# Patient Record
Sex: Male | Born: 1955 | Race: White | Hispanic: No | Marital: Married | Smoking: Former smoker
Health system: Southern US, Community
[De-identification: ages and names within clinical notes are randomized; demographics above are authoritative.]

## PROBLEM LIST (undated history)

## (undated) DIAGNOSIS — E7800 Pure hypercholesterolemia, unspecified: Secondary | ICD-10-CM

## (undated) DIAGNOSIS — M48061 Spinal stenosis, lumbar region without neurogenic claudication: Secondary | ICD-10-CM

## (undated) DIAGNOSIS — E756 Lipid storage disorder, unspecified: Secondary | ICD-10-CM

## (undated) DIAGNOSIS — E8809 Other disorders of plasma-protein metabolism, not elsewhere classified: Secondary | ICD-10-CM

## (undated) DIAGNOSIS — M1990 Unspecified osteoarthritis, unspecified site: Secondary | ICD-10-CM

## (undated) HISTORY — PX: APPENDECTOMY: SHX54

## (undated) HISTORY — PX: TONSILLECTOMY: SUR1361

## (undated) HISTORY — PX: OTHER SURGICAL HISTORY: SHX169

## (undated) HISTORY — PX: BACK SURGERY: SHX140

## (undated) HISTORY — DX: Spinal stenosis, lumbar region without neurogenic claudication: M48.061

## (undated) HISTORY — PX: SHOULDER SURGERY: SHX246

## (undated) HISTORY — PX: HERNIA REPAIR: SHX51

## (undated) HISTORY — PX: SINUS EXPLORATION: SHX5214

---

## 1999-05-06 ENCOUNTER — Encounter: Payer: Self-pay | Admitting: Orthopedic Surgery

## 1999-05-06 ENCOUNTER — Ambulatory Visit (HOSPITAL_COMMUNITY): Admission: RE | Admit: 1999-05-06 | Discharge: 1999-05-06 | Payer: Self-pay | Admitting: Orthopedic Surgery

## 2008-11-09 ENCOUNTER — Emergency Department (HOSPITAL_COMMUNITY): Admission: EM | Admit: 2008-11-09 | Discharge: 2008-11-09 | Payer: Self-pay | Admitting: Emergency Medicine

## 2008-11-10 ENCOUNTER — Ambulatory Visit: Payer: Self-pay | Admitting: Gastroenterology

## 2008-11-25 ENCOUNTER — Emergency Department (HOSPITAL_COMMUNITY): Admission: EM | Admit: 2008-11-25 | Discharge: 2008-11-25 | Payer: Self-pay | Admitting: Emergency Medicine

## 2009-09-21 ENCOUNTER — Encounter: Admission: RE | Admit: 2009-09-21 | Discharge: 2009-09-21 | Payer: Self-pay | Admitting: Orthopedic Surgery

## 2010-11-09 LAB — POCT CARDIAC MARKERS
CKMB, poc: 1.2 ng/mL (ref 1.0–8.0)
Troponin i, poc: <0.05 ng/mL (ref 0.00–0.09)

## 2010-11-09 LAB — COMPREHENSIVE METABOLIC PANEL
ALT: 73 U/L — ABNORMAL HIGH (ref 0–53)
AST: 41 U/L — ABNORMAL HIGH (ref 0–37)
Albumin: 4 g/dL (ref 3.5–5.2)
Calcium: 9.5 mg/dL (ref 8.4–10.5)
Creatinine, Ser: 0.79 mg/dL (ref 0.4–1.5)
GFR calc Af Amer: >60 mL/min (ref >60)
GFR calc non Af Amer: >60 mL/min (ref >60)
Sodium: 137 mEq/L (ref 135–145)
Total Protein: 7.4 g/dL (ref 6.0–8.3)

## 2010-11-09 LAB — CBC
HCT: 43.6 % (ref 39.0–52.0)
Hemoglobin: 15 g/dL (ref 13.0–17.0)
MCHC: 34.4 g/dL (ref 30.0–36.0)
MCV: 90.9 fL (ref 78.0–100.0)
Platelets: 215 10*3/uL (ref 150–400)
RBC: 4.8 MIL/uL (ref 4.22–5.81)
RDW: 12.9 % (ref 11.5–15.5)
WBC: 10.8 10*3/uL — ABNORMAL HIGH (ref 4.0–10.5)

## 2010-11-09 LAB — D-DIMER, QUANTITATIVE: D-Dimer, Quant: 0.39 ug/mL-FEU (ref 0.00–0.48)

## 2010-11-09 LAB — DIFFERENTIAL
Eosinophils Relative: 0 % (ref 0–5)
Lymphocytes Relative: 7 % — ABNORMAL LOW (ref 12–46)
Lymphs Abs: 0.8 10*3/uL (ref 0.7–4.0)
Monocytes Absolute: 0.6 10*3/uL (ref 0.1–1.0)
Monocytes Relative: 5 % (ref 3–12)

## 2011-11-15 ENCOUNTER — Emergency Department (HOSPITAL_COMMUNITY): Payer: Medicare Other

## 2011-11-15 ENCOUNTER — Encounter (HOSPITAL_COMMUNITY): Payer: Self-pay | Admitting: *Deleted

## 2011-11-15 ENCOUNTER — Emergency Department (HOSPITAL_COMMUNITY)
Admission: EM | Admit: 2011-11-15 | Discharge: 2011-11-16 | Disposition: A | Discharge status: Home / Self Care | Payer: Medicare Other | Attending: Emergency Medicine | Admitting: Emergency Medicine

## 2011-11-15 DIAGNOSIS — E7800 Pure hypercholesterolemia, unspecified: Secondary | ICD-10-CM | POA: Insufficient documentation

## 2011-11-15 DIAGNOSIS — R0602 Shortness of breath: Secondary | ICD-10-CM | POA: Insufficient documentation

## 2011-11-15 DIAGNOSIS — J45909 Unspecified asthma, uncomplicated: Secondary | ICD-10-CM | POA: Insufficient documentation

## 2011-11-15 DIAGNOSIS — Z87891 Personal history of nicotine dependence: Secondary | ICD-10-CM | POA: Insufficient documentation

## 2011-11-15 DIAGNOSIS — R079 Chest pain, unspecified: Secondary | ICD-10-CM | POA: Insufficient documentation

## 2011-11-15 HISTORY — DX: Pure hypercholesterolemia, unspecified: E78.00

## 2011-11-15 LAB — DIFFERENTIAL
Basophils Absolute: 0 10*3/uL (ref 0.0–0.1)
Basophils Relative: 1 % (ref 0–1)
Lymphocytes Relative: 20 % (ref 12–46)
Monocytes Relative: 8 % (ref 3–12)
Neutro Abs: 5.9 10*3/uL (ref 1.7–7.7)
Neutrophils Relative %: 68 % (ref 43–77)

## 2011-11-15 LAB — CBC
Hemoglobin: 14.8 g/dL (ref 13.0–17.0)
MCHC: 33.4 g/dL (ref 30.0–36.0)
RDW: 13.1 % (ref 11.5–15.5)
WBC: 8.7 10*3/uL (ref 4.0–10.5)

## 2011-11-15 LAB — CARDIAC PANEL(CRET KIN+CKTOT+MB+TROPI)
Relative Index: INVALID (ref 0.0–2.5)
Total CK: 96 U/L (ref 7–232)
Troponin I: <0.3 ng/mL (ref <0.30)

## 2011-11-15 LAB — BASIC METABOLIC PANEL
Chloride: 100 mEq/L (ref 96–112)
GFR calc Af Amer: >90 mL/min (ref >90)
Potassium: 4.8 mEq/L (ref 3.5–5.1)

## 2011-11-15 MED ORDER — IPRATROPIUM BROMIDE 0.02 % IN SOLN
0.5000 mg | Freq: Once | RESPIRATORY_TRACT | Status: AC
  Administered 2011-11-15: 0.5 mg via RESPIRATORY_TRACT
  Filled 2011-11-15: qty 2.5

## 2011-11-15 MED ORDER — ACETAMINOPHEN 500 MG PO TABS
1000.0000 mg | ORAL_TABLET | Freq: Once | ORAL | Status: AC
  Administered 2011-11-15: 1000 mg via ORAL
  Filled 2011-11-15: qty 2

## 2011-11-15 MED ORDER — CYCLOBENZAPRINE HCL 10 MG PO TABS
10.0000 mg | ORAL_TABLET | Freq: Once | ORAL | Status: AC
  Administered 2011-11-15: 10 mg via ORAL
  Filled 2011-11-15: qty 1

## 2011-11-15 MED ORDER — ALBUTEROL SULFATE (5 MG/ML) 0.5% IN NEBU
5.0000 mg | INHALATION_SOLUTION | Freq: Once | RESPIRATORY_TRACT | Status: AC
  Administered 2011-11-15: 5 mg via RESPIRATORY_TRACT
  Filled 2011-11-15: qty 1

## 2011-11-15 NOTE — ED Notes (Signed)
Pt stated he has had recurrent asthma attacks for past 5 days, last 30 minutes felt pain in rt side of chest with SOB and diaphoresis.

## 2011-11-15 NOTE — ED Provider Notes (Addendum)
History      CSN: [id number]  Arrival date & time 11/15/11  2022   First MD Initiated Contact with Patient 11/15/11 2200      Chief Complaint  Patient presents with  . Shortness of Breath  . Chest Pain    (Consider location/radiation/quality/duration/timing/severity/associated sxs/prior treatment) HPI....asthma for the past 5 days. Also muscular pain in the right side of her chest. Some shortness of breath. No substernal chest pain, nausea.  Deep breath makes symptoms worse. No radiation. Symptoms are minimal.      Past Medical History  Diagnosis Date  . Asthma   . High cholesterol     Past Surgical History  Procedure Date  . Appendectomy   . Back surgery   . Hernia repair   . Joint replacement       History  Substance Use Topics  . Smoking status: Former Games developer  . Smokeless tobacco: Not on file  . Alcohol Use: No      Review of Systems  All other systems reviewed and are negative.    10 Systems reviewed and all are negative for acute change except as noted in the HPI.    Allergies  Aspirin; Morphine and related; and Opium  Home Medications  No current outpatient prescriptions on file.  BP 128/78  Pulse 91  Temp(Src) 98.7 F (37.1 C) (Oral)  Resp 20  Ht 5\' 10"  (1.778 m)  Wt 200 lb (90.719 kg)  BMI 28.70 kg/m2  SpO2 98%  Physical Exam  Nursing note and vitals reviewed. Constitutional: He is oriented to person, place, and time. He appears well-developed and well-nourished.  HENT:  Head: Normocephalic and atraumatic.  Eyes: Conjunctivae and EOM are normal. Pupils are equal, round, and reactive to light.  Neck: Normal range of motion. Neck supple.  Cardiovascular: Normal rate and regular rhythm.   Pulmonary/Chest: Effort normal.       Slight expiratory wheeze  Abdominal: Soft. Bowel sounds are normal.  Musculoskeletal: Normal range of motion.  Neurological: He is alert and oriented to person, place, and time.  Skin: Skin is warm and  dry.  Psychiatric: He has a normal mood and affect.    ED Course  Procedures (including critical care time)  DIAGNOSTIC STUDIES: Oxygen Saturation is 98% on room air, normal by my interpretation.    COORDINATION OF CARE:  Results for orders placed during the hospital encounter of 11/15/11  CBC      Component Value Range   WBC 8.7  4.0 - 10.5 (K/uL)   RBC 4.84  4.22 - 5.81 (MIL/uL)   Hemoglobin 14.8  13.0 - 17.0 (g/dL)   HCT 40.9  81.1 - 91.4 (%)   MCV 91.5  78.0 - 100.0 (fL)   MCH 30.6  26.0 - 34.0 (pg)   MCHC 33.4  30.0 - 36.0 (g/dL)   RDW 78.2  95.6 - 21.3 (%)   Platelets 216  150 - 400 (K/uL)  DIFFERENTIAL      Component Value Range   Neutrophils Relative 68  43 - 77 (%)   Neutro Abs 5.9  1.7 - 7.7 (K/uL)   Lymphocytes Relative 20  12 - 46 (%)   Lymphs Abs 1.7  0.7 - 4.0 (K/uL)   Monocytes Relative 8  3 - 12 (%)   Monocytes Absolute 0.7  0.1 - 1.0 (K/uL)   Eosinophils Relative 3  0 - 5 (%)   Eosinophils Absolute 0.3  0.0 - 0.7 (K/uL)   Basophils Relative 1  0 - 1 (%)   Basophils Absolute 0.0  0.0 - 0.1 (K/uL)  BASIC METABOLIC PANEL      Component Value Range   Sodium 137  135 - 145 (mEq/L)   Potassium 4.8  3.5 - 5.1 (mEq/L)   Chloride 100  96 - 112 (mEq/L)   CO2 27  19 - 32 (mEq/L)   Glucose, Bld 107 (*) 70 - 99 (mg/dL)   BUN 11  6 - 23 (mg/dL)   Creatinine, Ser 0.10  0.50 - 1.35 (mg/dL)   Calcium 27.2  8.4 - 10.5 (mg/dL)   GFR calc non Af Amer >90  >90 (mL/min)   GFR calc Af Amer >90  >90 (mL/min)  CARDIAC PANEL(CRET KIN+CKTOT+MB+TROPI)      Component Value Range   Total CK 96  7 - 232 (U/L)   CK, MB 2.1  0.3 - 4.0 (ng/mL)   Troponin I <0.30  <0.30 (ng/mL)   Relative Index RELATIVE INDEX IS INVALID  0.0 - 2.5    No results found.  Labs Reviewed - No data to display No results found.   No diagnosis found.  **PM- EDP at bedside discusses treatment plan concerning   Date: 11/15/2011  Rate: 93  Rhythm: normal sinus rhythm  QRS Axis: normal   Intervals: normal  ST/T Wave abnormalities: normal  Conduction Disutrbances: none  Narrative Interpretation: unremarkable    MDM  Patient feeling better after breathing treatments. Screening tests normal.  Can discharge home. Prescription for Flexeril given for muscle tightness     I personally performed the services described in this documentation, which was scribed in my presence. The recorded information has been reviewed and considered.    Donnetta Hutching, MD 11/19/11 1546  Donnetta Hutching, MD 12/31/11 [phone]

## 2011-11-16 MED ORDER — CYCLOBENZAPRINE HCL 10 MG PO TABS: 10.0000 mg | ORAL_TABLET | Freq: Two times a day (BID) | ORAL | Status: AC | PRN

## 2011-11-16 NOTE — Discharge Instructions (Signed)
Tests were normal.  In your breathing treatments.  I believe the pain is in the wall of your chest.  Tylenol for pain

## 2015-08-01 HISTORY — PX: JOINT REPLACEMENT: SHX530

## 2016-03-13 ENCOUNTER — Emergency Department (HOSPITAL_COMMUNITY): Payer: Non-veteran care

## 2016-03-13 ENCOUNTER — Encounter (HOSPITAL_COMMUNITY): Payer: Self-pay | Admitting: Emergency Medicine

## 2016-03-13 ENCOUNTER — Emergency Department (HOSPITAL_COMMUNITY)
Admission: EM | Admit: 2016-03-13 | Discharge: 2016-03-13 | Disposition: A | Discharge status: Home / Self Care | Payer: Non-veteran care | Attending: Emergency Medicine | Admitting: Emergency Medicine

## 2016-03-13 DIAGNOSIS — J45909 Unspecified asthma, uncomplicated: Secondary | ICD-10-CM | POA: Insufficient documentation

## 2016-03-13 DIAGNOSIS — Z87891 Personal history of nicotine dependence: Secondary | ICD-10-CM | POA: Diagnosis not present

## 2016-03-13 DIAGNOSIS — R002 Palpitations: Secondary | ICD-10-CM | POA: Insufficient documentation

## 2016-03-13 HISTORY — DX: Other disorders of plasma-protein metabolism, not elsewhere classified: E88.09

## 2016-03-13 HISTORY — DX: Lipid storage disorder, unspecified: E75.6

## 2016-03-13 LAB — CBC WITH DIFFERENTIAL/PLATELET
BASOS ABS: 0 10*3/uL (ref 0.0–0.1)
Basophils Relative: 0 %
Eosinophils Absolute: 0 10*3/uL (ref 0.0–0.7)
Eosinophils Relative: 0 %
HEMATOCRIT: 41 % (ref 39.0–52.0)
HEMOGLOBIN: 13.5 g/dL (ref 13.0–17.0)
LYMPHS PCT: 14 %
Lymphs Abs: 1.7 10*3/uL (ref 0.7–4.0)
MCH: 30.3 pg (ref 26.0–34.0)
MCHC: 32.9 g/dL (ref 30.0–36.0)
MCV: 91.9 fL (ref 78.0–100.0)
MONO ABS: 0.8 10*3/uL (ref 0.1–1.0)
Monocytes Relative: 7 %
NEUTROS ABS: 9.1 10*3/uL — AB (ref 1.7–7.7)
NEUTROS PCT: 79 %
Platelets: 258 10*3/uL (ref 150–400)
RBC: 4.46 MIL/uL (ref 4.22–5.81)
RDW: 13.1 % (ref 11.5–15.5)
WBC: 11.6 10*3/uL — ABNORMAL HIGH (ref 4.0–10.5)

## 2016-03-13 LAB — COMPREHENSIVE METABOLIC PANEL
ALK PHOS: 57 U/L (ref 38–126)
ALT: 17 U/L (ref 17–63)
AST: 16 U/L (ref 15–41)
Albumin: 4.2 g/dL (ref 3.5–5.0)
Anion gap: 8 (ref 5–15)
BILIRUBIN TOTAL: 0.8 mg/dL (ref 0.3–1.2)
BUN: 10 mg/dL (ref 6–20)
CALCIUM: 9.3 mg/dL (ref 8.9–10.3)
CO2: 26 mmol/L (ref 22–32)
CREATININE: 0.64 mg/dL (ref 0.61–1.24)
Chloride: 104 mmol/L (ref 101–111)
GFR calc Af Amer: >60 mL/min (ref >60)
GLUCOSE: 114 mg/dL — AB (ref 65–99)
POTASSIUM: 3.9 mmol/L (ref 3.5–5.1)
Sodium: 138 mmol/L (ref 135–145)
TOTAL PROTEIN: 7.7 g/dL (ref 6.5–8.1)

## 2016-03-13 LAB — I-STAT TROPONIN, ED: Troponin i, poc: 0 ng/mL (ref 0.00–0.08)

## 2016-03-13 LAB — TROPONIN I

## 2016-03-13 MED ORDER — SODIUM CHLORIDE 0.9 % IV BOLUS (SEPSIS)
1000.0000 mL | Freq: Once | INTRAVENOUS | Status: AC
  Administered 2016-03-13: 1000 mL via INTRAVENOUS

## 2016-03-13 NOTE — Discharge Instructions (Signed)
You were seen in the ED today with heart palpitations. We evaluated you closely and did not find an reason for your symptoms. You should follow up with your PCP regarding a possible outpatient stress test.   Return to the ED immediately with any chest pain, difficulty breathing, arm pain, back pain, jaw/neck pain.

## 2016-03-13 NOTE — ED Provider Notes (Signed)
Emergency Department Provider Note   I have reviewed the triage vital signs and the nursing notes.   HISTORY  Chief Complaint Palpitations   HPI Levi Kim is a 60 y.o. male with PMH of asthma, HLD, recent hip replacement on Eliquis presents to the emergency department for evaluation of chest tightness and palpitations that started abruptly 6 hours prior to ED presentation. Patient states that he had some central chest tightness and palpitations with radiation to his arms. The patient suspects that he may have been dehydrated. He drank 3 glasses of water and had a bowel movement and symptoms improved. On arrival to the emergency department he has some continued mild chest pressure but no palpitations. No radiation down his arm. No exacerbating or alleviating factors. Pain is nonexertional. No associated dyspnea.   Past Medical History:  Diagnosis Date  . Alpha galactosidase deficiency   . Asthma   . High cholesterol     There are no active problems to display for this patient.   Past Surgical History:  Procedure Laterality Date  . APPENDECTOMY    . BACK SURGERY    . HERNIA REPAIR    . JOINT REPLACEMENT      Current Outpatient Rx  . Order #: [id number] Class: Historical Med  . Order #: [id number] Class: Historical Med  . Order #: [id number] Class: Historical Med  . Order #: [id number] Class: Historical Med  . Order #: [id number] Class: Historical Med  . Order #: [id number] Class: Historical Med  . Order #: [id number] Class: Historical Med  . Order #: [id number] Class: Historical Med  . Order #: [id number] Class: Historical Med    Allergies Aspirin; Morphine and related; and Opium  No family history on file.  Social History Social History  Substance Use Topics  . Smoking status: Former Games developermoker  . Smokeless tobacco: Never Used  . Alcohol use No    Review of Systems  Constitutional: No fever/chills Eyes: No visual changes. ENT: No sore throat. Cardiovascular: Positive chest  pain and palpitations.  Respiratory: Denies shortness of breath. Gastrointestinal: No abdominal pain.  No nausea, no vomiting.  No diarrhea.  No constipation. Genitourinary: Negative for dysuria. Musculoskeletal: Negative for back pain. Skin: Negative for rash. Neurological: Negative for headaches, focal weakness or numbness.  10-point ROS otherwise negative.  ____________________________________________   PHYSICAL EXAM:  VITAL SIGNS: ED Triage Vitals  Enc Vitals Group     BP 03/13/16 1629 162/82     Pulse Rate 03/13/16 1629 101     Resp 03/13/16 1629 17     Temp 03/13/16 1629 99.2 F (37.3 C)     Temp Source 03/13/16 1629 Temporal     SpO2 03/13/16 1629 94 %     Weight 03/13/16 1630 210 lb (95.3 kg)     Height 03/13/16 1630 5\' 9"  (1.753 m)     Pain Score 03/13/16 1640 6   Constitutional: Alert and oriented. Well appearing and in no acute distress. Eyes: Conjunctivae are normal. PERRL. EOMI. Head: Atraumatic. Nose: No congestion/rhinnorhea. Mouth/Throat: Mucous membranes are dry.  Oropharynx non-erythematous. Neck: No stridor.   Cardiovascular: Tachycardia. Good peripheral circulation. Grossly normal heart sounds.   Respiratory: Normal respiratory effort.  No retractions. Lungs CTAB. Gastrointestinal: Soft and nontender. No distention.  Musculoskeletal: No lower extremity tenderness nor edema. No gross deformities of extremities. Neurologic:  Normal speech and language. No gross focal neurologic deficits are appreciated.  Skin:  Skin is warm, dry and intact. No rash noted. Psychiatric: Mood and affect are normal. Speech and behavior  are normal.  ____________________________________________   LABS (all labs ordered are listed, but only abnormal results are displayed)  Labs Reviewed  CBC WITH DIFFERENTIAL/PLATELET - Abnormal; Notable for the following:       Result Value   WBC 11.6 (*)    Neutro Abs 9.1 (*)    All other components within normal limits    COMPREHENSIVE METABOLIC PANEL - Abnormal; Notable for the following:    Glucose, Bld 114 (*)    All other components within normal limits  TROPONIN I  I-STAT TROPOININ, ED   ____________________________________________  EKG  Reviewed in MUSE. No STEMI.  ____________________________________________  RADIOLOGY  Dg Chest 2 View  Result Date: 03/13/2016 CLINICAL DATA:  Chest pain and arm tingling. EXAM: CHEST  2 VIEW COMPARISON:  Chest radiograph 11/15/2011 FINDINGS: The lungs are well inflated. Cardiomediastinal contours are normal. No pneumothorax or pleural effusion. No focal airspace consolidation or pulmonary edema. IMPRESSION: No focal airspace disease. Electronically Signed   By: Deatra RobinsonKevin  Herman M.D.   On: 03/13/2016 17:17    ____________________________________________   PROCEDURES  Procedure(s) performed:   Procedures  None ____________________________________________   INITIAL IMPRESSION / ASSESSMENT AND PLAN / ED COURSE  Pertinent labs & imaging results that were available during my care of the patient were reviewed by me and considered in my medical decision making (see chart for details).  Patient resents to the emergency department for evaluation of chest tightness, palpitations that is since resolved. He does have an aspirin allergy and so will not give aspirin. Symptoms improved with rest and drinking water. Patient reports minimal symptoms at this time. HEART score 2 for age and HLD. Very low suspicion for DVT/PE. Plan for serial biomarkers and reassessment. Will give IV fluids and reassess. No evidence on exam of by history to suggest bleed 2/2 current anticoagulation.   11:04 PM Patient's repeat troponin is negative. He has not had additional symptoms since arrival to the emergency department. He is feeling well and has been ambulating around the ED while waiting for second troponin. Discussed in detail my plan for him including primary care physician follow-up  and discussion of scheduling outpatient stress test. Per these return to the emergency department immediately if he has any additional chest discomfort.   At this time, I do not feel there is any life-threatening condition present. I have reviewed and discussed all results (EKG, imaging, lab, urine as appropriate), exam findings with patient. I have reviewed nursing notes and appropriate previous records.  I feel the patient is safe to be discharged home without further emergent workup. Discussed usual and customary return precautions. Patient and family (if present) verbalize understanding and are comfortable with this plan.  Patient will follow-up with their primary care provider. If they do not have a primary care provider, information for follow-up has been provided to them. All questions have been answered.  ____________________________________________  FINAL CLINICAL IMPRESSION(S) / ED DIAGNOSES  Final diagnoses:  Heart palpitations     MEDICATIONS GIVEN DURING THIS VISIT:  Medications  sodium chloride 0.9 % bolus 1,000 mL (0 mLs Intravenous Stopped 03/13/16 2119)     NEW OUTPATIENT MEDICATIONS STARTED DURING THIS VISIT:  None   Note:  This document was prepared using Dragon voice recognition software and may include unintentional dictation errors.  Alona BeneJoshua Drevin Ortner, MD Emergency Medicine   Maia PlanJoshua G Natassia Guthridge, MD 03/14/16 [phone]

## 2016-03-13 NOTE — ED Triage Notes (Addendum)
PT states today around 1330 he started having chest palpations and became dizzy. PT also states some SOB at times today. PT denies any chest pain at this time. PT stated he had a hip replacement surgery x4 weeks ago and was started on Eliquis.

## 2017-01-03 ENCOUNTER — Encounter (HOSPITAL_COMMUNITY): Payer: Self-pay | Admitting: *Deleted

## 2017-01-03 ENCOUNTER — Emergency Department (HOSPITAL_COMMUNITY): Payer: Non-veteran care

## 2017-01-03 ENCOUNTER — Emergency Department (HOSPITAL_COMMUNITY)
Admission: EM | Admit: 2017-01-03 | Discharge: 2017-01-03 | Disposition: A | Discharge status: Home / Self Care | Payer: Non-veteran care | Attending: Emergency Medicine | Admitting: Emergency Medicine

## 2017-01-03 DIAGNOSIS — Z87891 Personal history of nicotine dependence: Secondary | ICD-10-CM | POA: Diagnosis not present

## 2017-01-03 DIAGNOSIS — Z96641 Presence of right artificial hip joint: Secondary | ICD-10-CM | POA: Diagnosis not present

## 2017-01-03 DIAGNOSIS — M25551 Pain in right hip: Secondary | ICD-10-CM | POA: Diagnosis present

## 2017-01-03 DIAGNOSIS — J45909 Unspecified asthma, uncomplicated: Secondary | ICD-10-CM | POA: Diagnosis not present

## 2017-01-03 DIAGNOSIS — Z79899 Other long term (current) drug therapy: Secondary | ICD-10-CM | POA: Insufficient documentation

## 2017-01-03 LAB — URINALYSIS, ROUTINE W REFLEX MICROSCOPIC
BACTERIA UA: NONE SEEN
Bilirubin Urine: NEGATIVE
Glucose, UA: NEGATIVE mg/dL
Ketones, ur: NEGATIVE mg/dL
Leukocytes, UA: NEGATIVE
Nitrite: NEGATIVE
Protein, ur: NEGATIVE mg/dL
RBC / HPF: NONE SEEN RBC/hpf (ref 0–5)
SPECIFIC GRAVITY, URINE: 1.002 — AB (ref 1.005–1.030)
Squamous Epithelial / LPF: NONE SEEN
WBC UA: NONE SEEN WBC/hpf (ref 0–5)
pH: 6 (ref 5.0–8.0)

## 2017-01-03 LAB — BASIC METABOLIC PANEL
Anion gap: 10 (ref 5–15)
BUN: 12 mg/dL (ref 6–20)
CHLORIDE: 104 mmol/L (ref 101–111)
CO2: 24 mmol/L (ref 22–32)
CREATININE: 0.71 mg/dL (ref 0.61–1.24)
Calcium: 9.5 mg/dL (ref 8.9–10.3)
GFR calc Af Amer: >60 mL/min (ref >60)
GFR calc non Af Amer: >60 mL/min (ref >60)
Glucose, Bld: 114 mg/dL — ABNORMAL HIGH (ref 65–99)
Potassium: 3.8 mmol/L (ref 3.5–5.1)
SODIUM: 138 mmol/L (ref 135–145)

## 2017-01-03 LAB — CBC WITH DIFFERENTIAL/PLATELET
Basophils Absolute: 0 10*3/uL (ref 0.0–0.1)
Basophils Relative: 0 %
EOS ABS: 0 10*3/uL (ref 0.0–0.7)
EOS PCT: 0 %
HCT: 46.7 % (ref 39.0–52.0)
HEMOGLOBIN: 15.7 g/dL (ref 13.0–17.0)
LYMPHS ABS: 1.2 10*3/uL (ref 0.7–4.0)
Lymphocytes Relative: 13 %
MCH: 31 pg (ref 26.0–34.0)
MCHC: 33.6 g/dL (ref 30.0–36.0)
MCV: 92.1 fL (ref 78.0–100.0)
MONO ABS: 0.4 10*3/uL (ref 0.1–1.0)
MONOS PCT: 5 %
Neutro Abs: 7.5 10*3/uL (ref 1.7–7.7)
Neutrophils Relative %: 82 %
PLATELETS: 227 10*3/uL (ref 150–400)
RBC: 5.07 MIL/uL (ref 4.22–5.81)
RDW: 13 % (ref 11.5–15.5)
WBC: 9.1 10*3/uL (ref 4.0–10.5)

## 2017-01-03 NOTE — Discharge Instructions (Signed)
You can try alternating ice and heat to your hip.  Follow-up with your orthopedic doctor for recheck.

## 2017-01-03 NOTE — ED Triage Notes (Signed)
Pt comes in with right hip pain. States he had a right hip replacement July 2017. Starting in the last 2-3 months he began having increased pain in this hip. He is ambulatory with NAD.

## 2017-01-04 LAB — B. BURGDORFI ANTIBODIES: B burgdorferi Ab IgG+IgM: <0.91 {ISR} (ref 0.00–0.90)

## 2017-01-05 LAB — ROCKY MTN SPOTTED FVR ABS PNL(IGG+IGM)
RMSF IGG: NEGATIVE
RMSF IGM: 0.48 {index} (ref 0.00–0.89)

## 2017-01-06 NOTE — ED Provider Notes (Signed)
[facility]-EMERGENCY DEPT Provider Note   CSN: [id number] Arrival date & time: 01/03/17  4098     History   Chief Complaint Chief Complaint  Patient presents with  . Hip Pain    HPI Levi Kim is a 61 y.o. male.  HPI   Levi Kim is a 61 y.o. male who presents to the Emergency Department complaining of Pain to the posterior right hip. Pain has been increasing in severity for several weeks. 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 He reports pain associated with flexion and with weightbearing. Pain occasionally radiates into his groin. It is not associated with urination. Pain improves at rest. He states that he was recently started on a topical steroid cream for a nonhealing area of his incision and he is concerned that he may be developing an infection. He denies fever, chills, vomiting, abdominal pain, and dysuria  Past Medical History:  Diagnosis Date  . Alpha galactosidase deficiency   . Asthma   . High cholesterol     There are no active problems to display for this patient.   Past Surgical History:  Procedure Laterality Date  . APPENDECTOMY    . BACK SURGERY    . Fistula Repair    . HERNIA REPAIR    . JOINT REPLACEMENT  2017  . SHOULDER SURGERY    . testical removal    . TONSILLECTOMY         Home Medications    Prior to Admission medications   Medication Sig Start Date End Date Taking? Authorizing Provider  acetaminophen (TYLENOL) 500 MG tablet Take 1,500 mg by mouth 3 (three) times daily as needed. For pain. **takes only two tablets as needed for headaches**   Yes [provider]  albuterol (PROVENTIL HFA) 108 (90 BASE) MCG/ACT inhaler Inhale 2 puffs into the lungs daily as needed.   Yes [provider]  alfuzosin (UROXATRAL) 10 MG 24 hr tablet Take 1 tablet by mouth daily. 12/21/15  Yes [provider]  docusate sodium (COLACE) 100 MG capsule Take 100 mg by mouth daily as needed  for mild constipation.   Yes [provider]  levocetirizine (XYZAL) 5 MG tablet Take 5 mg by mouth every evening.   Yes [provider]  simvastatin (ZOCOR) 20 MG tablet Take 10 mg by mouth at bedtime. 11/30/15  Yes [provider]  traZODone (DESYREL) 50 MG tablet Take 50 mg by mouth at bedtime.   Yes [provider]    Family History No family history on file.  Social History Social History  Substance Use Topics  . Smoking status: Former Games developer  . Smokeless tobacco: Never Used  . Alcohol use No     Allergies   Aspirin; Morphine and related; and Opium   Review of Systems Review of Systems  Constitutional: Negative for chills and fever.  Respiratory: Negative for chest tightness and shortness of breath.   Cardiovascular: Negative for chest pain.  Gastrointestinal: Negative for abdominal pain, nausea and vomiting.  Genitourinary: Negative for difficulty urinating, dysuria, flank pain, frequency, hematuria, scrotal swelling and testicular pain.  Musculoskeletal: Positive for arthralgias (right hip pain). Negative for joint swelling.  Skin: Negative for color change and wound.  Neurological: Negative for weakness and numbness.  All other systems reviewed and are negative.    Physical Exam Updated Vital Signs BP 120/87 (BP Location: Left Arm)   Pulse 80   Temp 98.8 F (37.1 C) (  Oral)   Resp 18   Ht 5\' 9"  (1.753 m)   Wt 95.3 kg (210 lb)   SpO2 98%   BMI 31.01 kg/m   Physical Exam  Constitutional: He is oriented to person, place, and time. He appears well-developed and well-nourished. No distress.  HENT:  Mouth/Throat: Oropharynx is clear and moist.  Neck: Normal range of motion.  Cardiovascular: Normal rate, regular rhythm, normal heart sounds and intact distal pulses.   Pulmonary/Chest: Effort normal and breath sounds normal. No respiratory distress.  Abdominal: Soft. He exhibits no distension. There is no tenderness. There is no  guarding.  Musculoskeletal: He exhibits tenderness. He exhibits no deformity.  Right hip pain with flexion.  Neg SLR bilaterally.  No edema or erythema over the joint.  Incision appears well healed.   Neurological: He is alert and oriented to person, place, and time. No sensory deficit.  Skin: Skin is warm. Capillary refill takes less than 2 seconds.  Nursing note and vitals reviewed.    ED Treatments / Results  Labs (all labs ordered are listed, but only abnormal results are displayed) Labs Reviewed  BASIC METABOLIC PANEL - Abnormal; Notable for the following:       Result Value   Glucose, Bld 114 (*)    All other components within normal limits  URINALYSIS, ROUTINE W REFLEX MICROSCOPIC - Abnormal; Notable for the following:    Color, Urine STRAW (*)    Specific Gravity, Urine 1.002 (*)    Hgb urine dipstick SMALL (*)    All other components within normal limits  CBC WITH DIFFERENTIAL/PLATELET  ROCKY MTN SPOTTED FVR ABS PNL(IGG+IGM)  B. BURGDORFI ANTIBODIES    EKG  EKG Interpretation None       Radiology Dg Lumbar Spine Complete  Result Date: 01/03/2017 CLINICAL DATA:  Right posterior hip pain extending to the knee. EXAM: LUMBAR SPINE - COMPLETE 4+ VIEW COMPARISON:  None. FINDINGS: Previous interbody fusion at L5-S1. The fusion is solid. Mild bilateral facet arthritis at L4-5. Minimal disc space narrowing at L4-5. Alignment is normal. No fractures or bone destruction. IMPRESSION: Slight degenerative facet arthritis at L4-5 with slight disc degeneration at L4-5. Solid fusion at L5-S1. Electronically Signed   By: Francene Boyers M.D.   On: 01/03/2017 10:34   Dg Hip Unilat W Or Wo Pelvis 2-3 Views Right  Result Date: 01/03/2017 CLINICAL DATA:  Posterior right hip pain that radiates to the knee. Total hip prosthesis inserted in 2017. EXAM: DG HIP (WITH OR WITHOUT PELVIS) 2-3V RIGHT COMPARISON:  None. FINDINGS: The components of the total hip prosthesis appear in excellent position  with no evidence of loosening. There is no fracture or other significant abnormality. IMPRESSION: No acute abnormality. Satisfactory appearance of the right hip with total hip prosthesis in place. Electronically Signed   By: Francene Boyers M.D.   On: 01/03/2017 10:33     Procedures Procedures (including critical care time)  Medications Ordered in ED Medications - No data to display   Initial Impression / Assessment and Plan / ED Course  I have reviewed the triage vital signs and the nursing notes.  Pertinent labs & imaging results that were available during my care of the patient were reviewed by me and considered in my medical decision making (see chart for details).     Pt well appearing.  Ambulatory with steady gait. No focal neuro deficits.   No concerning sx's for septic joint.    Final Clinical Impressions(s) / ED Diagnoses  Final diagnoses:  Right hip pain    New Prescriptions Discharge Medication List as of 01/03/2017 11:18 AM       Pauline Ausriplett, Terrill Alperin, PA-C 01/06/17 2211    Bethann BerkshireZammit, Joseph, MD 01/09/17 1622

## 2017-07-04 DIAGNOSIS — R1084 Generalized abdominal pain: Secondary | ICD-10-CM | POA: Insufficient documentation

## 2018-04-09 ENCOUNTER — Emergency Department (HOSPITAL_COMMUNITY)
Admission: EM | Admit: 2018-04-09 | Discharge: 2018-04-09 | Disposition: A | Discharge status: Home / Self Care | Payer: Non-veteran care | Attending: Emergency Medicine | Admitting: Emergency Medicine

## 2018-04-09 ENCOUNTER — Other Ambulatory Visit: Payer: Self-pay

## 2018-04-09 ENCOUNTER — Encounter (HOSPITAL_COMMUNITY): Payer: Self-pay | Admitting: Emergency Medicine

## 2018-04-09 ENCOUNTER — Emergency Department (HOSPITAL_COMMUNITY): Payer: Non-veteran care

## 2018-04-09 DIAGNOSIS — M545 Low back pain, unspecified: Secondary | ICD-10-CM

## 2018-04-09 DIAGNOSIS — J45909 Unspecified asthma, uncomplicated: Secondary | ICD-10-CM | POA: Diagnosis not present

## 2018-04-09 DIAGNOSIS — Z79899 Other long term (current) drug therapy: Secondary | ICD-10-CM | POA: Diagnosis not present

## 2018-04-09 DIAGNOSIS — Z87891 Personal history of nicotine dependence: Secondary | ICD-10-CM | POA: Diagnosis not present

## 2018-04-09 DIAGNOSIS — Z9660 Presence of unspecified orthopedic joint implant: Secondary | ICD-10-CM | POA: Insufficient documentation

## 2018-04-09 MED ORDER — ACETAMINOPHEN 500 MG PO TABS
1000.0000 mg | ORAL_TABLET | Freq: Once | ORAL | Status: AC
  Administered 2018-04-09: 1000 mg via ORAL
  Filled 2018-04-09: qty 2

## 2018-04-09 NOTE — Discharge Instructions (Addendum)
Return if any problems.  Tylenol for  pain.  See your Physician for recheck

## 2018-04-09 NOTE — ED Provider Notes (Signed)
[facility] EMERGENCY DEPARTMENT Provider Note   CSN: [id number] Arrival date & time: 04/09/18  1440     History   Chief Complaint Chief Complaint  Patient presents with  . Back Pain    HPI SHADRICK BIERLEY is a 62 y.o. male.  The history is provided by the patient. No language interpreter was used.  Back Pain   This is a new problem. The current episode started 12 to 24 hours ago. The problem occurs constantly. The problem has been gradually worsening. The pain is associated with no known injury. The pain is present in the lumbar spine. The quality of the pain is described as stabbing. The pain is moderate. The symptoms are aggravated by bending. Pertinent negatives include no chest pain and no abdominal pain. He has tried nothing for the symptoms. The treatment provided no relief.  Pt complains of pain in his low back and his right hip.  Pt has had a fusion of ls spine and a hip replacement  Past Medical History:  Diagnosis Date  . Alpha galactosidase deficiency   . Asthma   . High cholesterol     There are no active problems to display for this patient.   Past Surgical History:  Procedure Laterality Date  . APPENDECTOMY    . BACK SURGERY    . Fistula Repair    . HERNIA REPAIR    . JOINT REPLACEMENT  2017  . SHOULDER SURGERY    . SINUS EXPLORATION    . testical removal    . TONSILLECTOMY          Home Medications    Prior to Admission medications   Medication Sig Start Date End Date Taking? Authorizing Provider  acetaminophen (TYLENOL) 500 MG tablet Take 1,500 mg by mouth 3 (three) times daily as needed. For pain. **takes only two tablets as needed for headaches**    [provider]  albuterol (PROVENTIL HFA) 108 (90 BASE) MCG/ACT inhaler Inhale 2 puffs into the lungs daily as needed.    [provider]  alfuzosin (UROXATRAL) 10 MG 24 hr tablet Take 1 tablet by mouth daily. 12/21/15   [provider]  docusate sodium (COLACE) 100 MG  capsule Take 100 mg by mouth daily as needed for mild constipation.    [provider]  levocetirizine (XYZAL) 5 MG tablet Take 5 mg by mouth every evening.    [provider]  simvastatin (ZOCOR) 20 MG tablet Take 10 mg by mouth at bedtime. 11/30/15   [provider]  traZODone (DESYREL) 50 MG tablet Take 50 mg by mouth at bedtime.    [provider]    Family History History reviewed. No pertinent family history.  Social History Social History   Tobacco Use  . Smoking status: Former Games developer  . Smokeless tobacco: Never Used  Substance Use Topics  . Alcohol use: No  . Drug use: No     Allergies   Aspirin; Morphine and related; and Opium   Review of Systems Review of Systems  Cardiovascular: Negative for chest pain.  Gastrointestinal: Negative for abdominal pain.  Musculoskeletal: Positive for back pain.  All other systems reviewed and are negative.    Physical Exam Updated Vital Signs BP (!) 144/84 (BP Location: Right Arm)   Pulse 91   Temp 98.3 F (36.8 C) (Oral)   Resp 18   Ht 5\' 10"  (1.778 m)   Wt 88.5 kg   SpO2 96%   BMI 27.98 kg/m  Physical Exam  Constitutional: He appears well-developed and well-nourished.  HENT:  Head: Normocephalic and atraumatic.  Eyes: Conjunctivae are normal.  Neck: Neck supple.  Cardiovascular: Normal rate and regular rhythm.  No murmur heard. Pulmonary/Chest: Effort normal and breath sounds normal. No respiratory distress.  Abdominal: Soft. There is no tenderness.  Musculoskeletal: Normal range of motion. He exhibits tenderness. He exhibits no edema.  Tender right hip,  Pain with moving,  Tender lower spine   Neurological: He is alert.  Skin: Skin is warm and dry.  Psychiatric: He has a normal mood and affect.  Nursing note and vitals reviewed.    ED Treatments / Results  Labs (all labs ordered are listed, but only abnormal results are displayed) Labs Reviewed - No data to  display  EKG None  Radiology No results found.  Procedures Procedures (including critical care time)  Medications Ordered in ED Medications - No data to display   Initial Impression / Assessment and Plan / ED Course  I have reviewed the triage vital signs and the nursing notes.  Pertinent labs & imaging results that were available during my care of the patient were reviewed by me and considered in my medical decision making (see chart for details).   MDM  Xrays reviewed and discussed with pt  Pt declines pain medications.  Pt reports he can only take tylenol  Final Clinical Impressions(s) / ED Diagnoses   Final diagnoses:  Acute right-sided low back pain without sciatica    ED Discharge Orders    None    An After Visit Summary was printed and given to the patient.    Elson Areas, [place] 04/09/18 1714    Maia Plan, MD 04/09/18 2007

## 2018-04-09 NOTE — ED Notes (Signed)
EDP at bedside  

## 2018-04-09 NOTE — ED Notes (Signed)
Patient transported to X-ray 

## 2018-04-09 NOTE — ED Triage Notes (Signed)
PT c/o lower back and right hip pain when he woke up this morning. PT denies any new injury but states had a hip replacement in 2017.

## 2019-07-28 ENCOUNTER — Emergency Department (HOSPITAL_COMMUNITY): Payer: No Typology Code available for payment source

## 2019-07-28 ENCOUNTER — Encounter (HOSPITAL_COMMUNITY): Payer: Self-pay

## 2019-07-28 ENCOUNTER — Other Ambulatory Visit: Payer: Self-pay

## 2019-07-28 ENCOUNTER — Emergency Department (HOSPITAL_COMMUNITY)
Admission: EM | Admit: 2019-07-28 | Discharge: 2019-07-28 | Disposition: A | Discharge status: Home / Self Care | Payer: No Typology Code available for payment source | Attending: Emergency Medicine | Admitting: Emergency Medicine

## 2019-07-28 DIAGNOSIS — Z9660 Presence of unspecified orthopedic joint implant: Secondary | ICD-10-CM | POA: Insufficient documentation

## 2019-07-28 DIAGNOSIS — Z79899 Other long term (current) drug therapy: Secondary | ICD-10-CM | POA: Diagnosis not present

## 2019-07-28 DIAGNOSIS — Z20828 Contact with and (suspected) exposure to other viral communicable diseases: Secondary | ICD-10-CM | POA: Diagnosis not present

## 2019-07-28 DIAGNOSIS — D72829 Elevated white blood cell count, unspecified: Secondary | ICD-10-CM | POA: Insufficient documentation

## 2019-07-28 DIAGNOSIS — R109 Unspecified abdominal pain: Secondary | ICD-10-CM | POA: Diagnosis not present

## 2019-07-28 DIAGNOSIS — J45909 Unspecified asthma, uncomplicated: Secondary | ICD-10-CM | POA: Insufficient documentation

## 2019-07-28 DIAGNOSIS — R5383 Other fatigue: Secondary | ICD-10-CM | POA: Diagnosis present

## 2019-07-28 DIAGNOSIS — R079 Chest pain, unspecified: Secondary | ICD-10-CM

## 2019-07-28 HISTORY — DX: Unspecified osteoarthritis, unspecified site: M19.90

## 2019-07-28 LAB — URINALYSIS, ROUTINE W REFLEX MICROSCOPIC
Bilirubin Urine: NEGATIVE
Glucose, UA: NEGATIVE mg/dL
Hgb urine dipstick: NEGATIVE
Ketones, ur: NEGATIVE mg/dL
Leukocytes,Ua: NEGATIVE
Nitrite: NEGATIVE
Protein, ur: NEGATIVE mg/dL
Specific Gravity, Urine: 1.002 — ABNORMAL LOW (ref 1.005–1.030)
pH: 8 (ref 5.0–8.0)

## 2019-07-28 LAB — CBC WITH DIFFERENTIAL/PLATELET
Abs Immature Granulocytes: 0.08 10*3/uL — ABNORMAL HIGH (ref 0.00–0.07)
Basophils Absolute: 0.1 10*3/uL (ref 0.0–0.1)
Basophils Relative: 0 %
Eosinophils Absolute: 0.1 10*3/uL (ref 0.0–0.5)
Eosinophils Relative: 1 %
HCT: 46 % (ref 39.0–52.0)
Hemoglobin: 15.1 g/dL (ref 13.0–17.0)
Immature Granulocytes: 0 %
Lymphocytes Relative: 7 %
Lymphs Abs: 1.2 10*3/uL (ref 0.7–4.0)
MCH: 30.6 pg (ref 26.0–34.0)
MCHC: 32.8 g/dL (ref 30.0–36.0)
MCV: 93.3 fL (ref 80.0–100.0)
Monocytes Absolute: 0.7 10*3/uL (ref 0.1–1.0)
Monocytes Relative: 4 %
Neutro Abs: 16 10*3/uL — ABNORMAL HIGH (ref 1.7–7.7)
Neutrophils Relative %: 88 %
Platelets: 297 10*3/uL (ref 150–400)
RBC: 4.93 MIL/uL (ref 4.22–5.81)
RDW: 13.1 % (ref 11.5–15.5)
WBC: 18.2 10*3/uL — ABNORMAL HIGH (ref 4.0–10.5)
nRBC: 0 % (ref 0.0–0.2)

## 2019-07-28 LAB — COMPREHENSIVE METABOLIC PANEL
ALT: 13 U/L (ref 0–44)
AST: 18 U/L (ref 15–41)
Albumin: 3.8 g/dL (ref 3.5–5.0)
Alkaline Phosphatase: 53 U/L (ref 38–126)
Anion gap: 12 (ref 5–15)
BUN: 10 mg/dL (ref 8–23)
CO2: 25 mmol/L (ref 22–32)
Calcium: 9.3 mg/dL (ref 8.9–10.3)
Chloride: 101 mmol/L (ref 98–111)
Creatinine, Ser: 0.71 mg/dL (ref 0.61–1.24)
GFR calc Af Amer: >60 mL/min (ref >60)
GFR calc non Af Amer: >60 mL/min (ref >60)
Glucose, Bld: 108 mg/dL — ABNORMAL HIGH (ref 70–99)
Potassium: 3.9 mmol/L (ref 3.5–5.1)
Sodium: 138 mmol/L (ref 135–145)
Total Bilirubin: 1.1 mg/dL (ref 0.3–1.2)
Total Protein: 7.6 g/dL (ref 6.5–8.1)

## 2019-07-28 LAB — RESPIRATORY PANEL BY RT PCR (FLU A&B, COVID)
Influenza A by PCR: NEGATIVE
Influenza B by PCR: NEGATIVE
SARS Coronavirus 2 by RT PCR: NEGATIVE

## 2019-07-28 LAB — TROPONIN I (HIGH SENSITIVITY): Troponin I (High Sensitivity): <2 ng/L (ref <18)

## 2019-07-28 MED ORDER — IOHEXOL 300 MG/ML  SOLN
100.0000 mL | Freq: Once | INTRAMUSCULAR | Status: AC | PRN
  Administered 2019-07-28: 100 mL via INTRAVENOUS

## 2019-07-28 MED ORDER — SODIUM CHLORIDE 0.9 % IV BOLUS
1000.0000 mL | Freq: Once | INTRAVENOUS | Status: AC
  Administered 2019-07-28: 1000 mL via INTRAVENOUS

## 2019-07-28 NOTE — Discharge Instructions (Signed)
It is not clear what was causing her symptoms however your testing has been rather unremarkable except for an elevated blood count.  The CT scan of your abdomen and pelvis did not show anything abnormal nor did any of your other testing.  Please return to the emergency department for any severe or worsening symptoms.  I would encourage you to follow-up with your doctor in 1 week or less and have them obtain the results of your studies to go over with you.

## 2019-07-28 NOTE — ED Triage Notes (Addendum)
Pt reports for the past 3 weeks he has had fatigue, chills, low grade fever.  Reports had some palpitations Tuesday.  Pt says he went to [facility] hospital in [place] and was tested for covid.  Reports was negative.  Pt also reports pain in left chest/clavicle area.  Reports pain has been "building" for the past 3 weeks.  Pt reports has arthritis and is hurting all over.

## 2019-07-28 NOTE — ED Provider Notes (Signed)
[facility] EMERGENCY DEPARTMENT Provider Note   CSN: [id number] Arrival date & time: 07/28/19  1007     History Chief Complaint  Patient presents with  . Fatigue    RYEN RHAMES is a 63 y.o. male history of chronic joint pain, high cholesterol, arthritis, presenting to the emergency department with fatigue and chills.  Patient reports that he has had generalized fatigue for approximately 3 weeks.  He says it tends to be worse in the morning and is waking up, but seems to last throughout the day.  May be worse later in the day.  He says he feels he just does not have energy to do things.  He reports that approximate 1 week ago, he was sitting outside with a light jacket on a 50 degree weather and began to have persistent chills.  The chills have continued on and off throughout the course of the week.  He denies any shortness of breath or cough.  He said he went and had a negative Covid test done as an outpatient a few days ago.  He denies any nausea, vomiting, or diarrhea.  He reports intermittent twinges of pain in his abdomen but these are nonspecific.  He denies any new dysuria or hematuria.  He says he has history of enlarged prostate.  He lives at home with his wife, who is a chemotherapy patient.  He reports that she is in her usual state of health.  He denies that she has any similar symptoms.  He reports he does have carbon monoxide monitor in the house and there have been no issues with.  He reports he does have gas eating.  HPI     Past Medical History:  Diagnosis Date  . Alpha galactosidase deficiency   . Arthritis   . Asthma   . High cholesterol     There are no problems to display for this patient.   Past Surgical History:  Procedure Laterality Date  . APPENDECTOMY    . BACK SURGERY    . Fistula Repair    . HERNIA REPAIR    . JOINT REPLACEMENT  2017  . SHOULDER SURGERY    . SINUS EXPLORATION    . testical removal    . TONSILLECTOMY         No  family history on file.  Social History   Tobacco Use  . Smoking status: Former Games developer  . Smokeless tobacco: Never Used  Substance Use Topics  . Alcohol use: No  . Drug use: No    Home Medications Prior to Admission medications   Medication Sig Start Date End Date Taking? Authorizing Provider  acetaminophen (TYLENOL) 500 MG tablet Take 1,500 mg by mouth 3 (three) times daily as needed. For pain. **takes only two tablets as needed for headaches**   Yes [provider]  albuterol (PROVENTIL HFA) 108 (90 BASE) MCG/ACT inhaler Inhale 2 puffs into the lungs daily as needed.   Yes [provider]  alfuzosin (UROXATRAL) 10 MG 24 hr tablet Take 1 tablet by mouth daily. 12/21/15  Yes [provider]  levocetirizine (XYZAL) 5 MG tablet Take 5 mg by mouth every evening.   Yes [provider]  simvastatin (ZOCOR) 20 MG tablet Take 10 mg by mouth at bedtime. 11/30/15  Yes [provider]  traZODone (DESYREL) 50 MG tablet Take 50 mg by mouth at bedtime.   Yes [provider]  docusate sodium (COLACE) 100 MG capsule Take 100 mg by mouth  daily as needed for mild constipation.    [provider]    Allergies    Aspirin, Augmentin [amoxicillin-pot clavulanate], Ciprofloxacin, Morphine and related, Opium, Prednisone, and Sulfa antibiotics  Review of Systems   Review of Systems  Constitutional: Positive for appetite change, chills and fatigue. Negative for fever.  HENT: Negative for ear pain and sore throat.   Eyes: Negative for photophobia and visual disturbance.  Respiratory: Negative for cough and shortness of breath.   Cardiovascular: Positive for chest pain. Negative for palpitations.  Gastrointestinal: Positive for abdominal pain. Negative for constipation, diarrhea, nausea and vomiting.  Musculoskeletal: Positive for arthralgias and myalgias.  Skin: Negative for color change and rash.  Neurological: Negative for syncope,  light-headedness and headaches.  All other systems reviewed and are negative.   Physical Exam Updated Vital Signs BP 131/82   Pulse 72   Temp 98.6 F (37 C) (Oral)   Resp 18   SpO2 100%   Physical Exam Vitals and nursing note reviewed.  Constitutional:      Appearance: He is well-developed.  HENT:     Head: Normocephalic and atraumatic.  Eyes:     Conjunctiva/sclera: Conjunctivae normal.  Cardiovascular:     Rate and Rhythm: Normal rate and regular rhythm.     Pulses: Normal pulses.  Pulmonary:     Effort: Pulmonary effort is normal. No respiratory distress.     Breath sounds: Normal breath sounds.  Abdominal:     Palpations: Abdomen is soft.     Tenderness: There is no abdominal tenderness. There is no guarding or rebound. Negative signs include Murphy's sign, Rovsing's sign and McBurney's sign.  Musculoskeletal:     Cervical back: Neck supple.  Skin:    General: Skin is warm and dry.  Neurological:     General: No focal deficit present.     Mental Status: He is alert and oriented to person, place, and time.  Psychiatric:        Mood and Affect: Mood normal.        Behavior: Behavior normal.     ED Results / Procedures / Treatments   Labs (all labs ordered are listed, but only abnormal results are displayed) Labs Reviewed  CBC WITH DIFFERENTIAL/PLATELET - Abnormal; Notable for the following components:      Result Value   WBC 18.2 (*)    Neutro Abs 16.0 (*)    Abs Immature Granulocytes 0.08 (*)    All other components within normal limits  COMPREHENSIVE METABOLIC PANEL - Abnormal; Notable for the following components:   Glucose, Bld 108 (*)    All other components within normal limits  URINALYSIS, ROUTINE W REFLEX MICROSCOPIC - Abnormal; Notable for the following components:   Color, Urine STRAW (*)    Specific Gravity, Urine 1.002 (*)    All other components within normal limits  RESPIRATORY PANEL BY RT PCR (FLU A&B, COVID)  TROPONIN I (HIGH SENSITIVITY)     EKG EKG Interpretation  Date/Time:  Monday July 28 2019 10:30:24 EST Ventricular Rate:  102 PR Interval:  128 QRS Duration: 82 QT Interval:  338 QTC Calculation: 440 R Axis:   94 Text Interpretation: Sinus tachycardia Possible Left atrial enlargement Rightward axis Septal infarct , age undetermined Abnormal ECG No STEMI Confirmed by Alvester Chourifan, Jhordyn Hoopingarner [phone]([id number]) on 07/28/2019 12:46:49 PM   Radiology CT ABDOMEN PELVIS W CONTRAST  Result Date: 07/28/2019 CLINICAL DATA:  Fatigue. Chills. Low-grade fever. Palpitations. Abdominal abscess/infection suspected. Prior key ectomy. Appendectomy. Hernia repair.  EXAM: CT ABDOMEN AND PELVIS WITH CONTRAST TECHNIQUE: Multidetector CT imaging of the abdomen and pelvis was performed using the standard protocol following bolus administration of intravenous contrast. CONTRAST:  151mL OMNIPAQUE IOHEXOL 300 MG/ML  SOLN COMPARISON:  None. FINDINGS: Lower chest: Bibasilar scarring or subsegmental atelectasis. Normal heart size. Trace, likely physiologic right pleural fluid including on 21/2. Hepatobiliary: Normal liver. Normal gallbladder, without biliary ductal dilatation. Pancreas: Normal, without mass or ductal dilatation. Spleen: Normal in size, without focal abnormality. Adrenals/Urinary Tract: Normal adrenal glands. Lower pole right renal 1.4 cm cyst. Normal left kidney. Degraded evaluation of the pelvis, secondary to beam hardening artifact from right hip arthroplasty. Grossly normal urinary bladder. Stomach/Bowel: Normal stomach, without wall thickening. Scattered colonic diverticula. Normal colon. Appendectomy. Normal small bowel. Vascular/Lymphatic: Aortic and branch vessel atherosclerosis. No abdominopelvic adenopathy. Reproductive: Grossly normal prostate.  Right orchiectomy. Other: No significant free fluid. Musculoskeletal: Right hip arthroplasty. Interbody fusion material at the lumbosacral junction. IMPRESSION: 1.  No acute process or explanation for  fever, chills, fatigue. 2.  Aortic Atherosclerosis (ICD10-I70.0). 3. Degraded evaluation of the pelvis, secondary to beam hardening artifact from right hip arthroplasty. Electronically Signed   By: Abigail Miyamoto M.D.   On: 07/28/2019 17:27   DG Chest Port 1 View  Result Date: 07/28/2019 CLINICAL DATA:  Chest pain. Additional history provided: Patient reports 3 weeks of fatigue, chills, low grade fever, with some palpitations on Tuesday. EXAM: PORTABLE CHEST 1 VIEW COMPARISON:  Chest radiograph 03/13/2016 FINDINGS: Heart size within normal limits.  Aortic atherosclerosis There is no airspace consolidation within the lungs. No evidence of pleural effusion or pneumothorax. No acute bony abnormality. IMPRESSION: No evidence of acute cardiopulmonary abnormality. Aortic atherosclerosis. Electronically Signed   By: Kellie Simmering DO   On: 07/28/2019 13:41    Procedures Procedures (including critical care time)  Medications Ordered in ED Medications  sodium chloride 0.9 % bolus 1,000 mL (0 mLs Intravenous Stopped 07/28/19 1902)  iohexol (OMNIPAQUE) 300 MG/ML solution 100 mL (100 mLs Intravenous Contrast Given 07/28/19 1658)    ED Course  I have reviewed the triage vital signs and the nursing notes.  Pertinent labs & imaging results that were available during my care of the patient were reviewed by me and considered in my medical decision making (see chart for details).  This is a well-appearing 63 year old male presenting to the emergency department with myalgias, fatigue for several days to weeks.  Also reporting several days of chills.  Feels his symptoms are worse in the morning when he wakes up.  They get better throughout the day.  He tells me has gas eating in the house.  We discussed the possibility of carbon monoxide, however his wife lives with him and sleeps in the same bed has had no symptoms.  They also have carbon monoxide monitors at home which have not gone off.  My suspicion is lower for  this at this time.  No signs or symptoms of meningitis Benign abdominal exam  I also discussed the possibility of Covid and he is amenable to repeat Covid testing.  I will add on a flu test given is a start of flu season.  He is afebrile on arrival.  He has mild tachycardia.  He is not hypoxic or tachypneic.  His blood pressure appears stable.  We will check labs and perform an infectious work-up occluding chest x-ray and UA.  Check troponin and EKG, throughout the possibly of atypical ACS (he has no significant cardiac history).  With no hypoxia, tachypnea, respiratory symptoms, my suspicion for PE is lower at this time  KRUZ CHIU was evaluated in Emergency Department on 07/29/2019 for the symptoms described in the history of present illness. He was evaluated in the context of the global COVID-19 pandemic, which necessitated consideration that the patient might be at risk for infection with the SARS-CoV-2 virus that causes COVID-19. Institutional protocols and algorithms that pertain to the evaluation of patients at risk for COVID-19 are in a state of rapid change based on information released by regulatory bodies including the CDC and federal and state organizations. These policies and algorithms were followed during the patient's care in the ED.  This note was dictated using dragon dictation software.  Please be aware that there may be minor translation errors as a result of this oral dictation   Clinical Course as of Jul 28 736  Mon Jul 28, 2019  1542 Discussed with the patient his work-up.  There does not appear to be a clear source for his leukocytosis, but he does express concern over this lab finding.  Explained it remains possible (although unlikely) that he does have an intra-abdominal infection that is simply not manifesting itself with any overt symptoms including GI symptoms, after engaging some shared decision making, we do believe that it is reasonable to obtain a CT scan of  the abdomen is here.  Otherwise his UA shows no signs of infection.  Chest x-ray is clear (and he has no respiratory symptoms).  His Covid testing is negative.  He has no headache or evidence of meningitis.  If his CT scan is negative, I believe it is reasonable to discharge him home.   [MT]    Clinical Course User Index [MT] Ezekeil Bethel, Kermit Balo, MD    Final Clinical Impression(s) / ED Diagnoses Final diagnoses:  Leukocytosis, unspecified type    Rx / DC Orders ED Discharge Orders    None       Terald Sleeper, MD 07/29/19 [phone]

## 2019-07-28 NOTE — ED Provider Notes (Signed)
I have reviewed the patient's test with him, there does not appear to be any specific findings on exam.  The patient is agreeable to discharge, he states he will come back if things get worse, he does not want to stay any longer for any further testing which I think is reasonable since he looks well, feels well and has normal vital signs.  He has been informed of his elevated blood counts and will follow up with his doctor within the week.   Noemi Chapel, MD 07/28/19 [phone]

## 2019-11-25 ENCOUNTER — Ambulatory Visit
Admission: EM | Admit: 2019-11-25 | Discharge: 2019-11-25 | Disposition: A | Discharge status: Home / Self Care | Payer: Medicare HMO | Attending: Emergency Medicine | Admitting: Emergency Medicine

## 2019-11-25 ENCOUNTER — Other Ambulatory Visit: Payer: Self-pay

## 2019-11-25 DIAGNOSIS — H9203 Otalgia, bilateral: Secondary | ICD-10-CM

## 2019-11-25 DIAGNOSIS — H60393 Other infective otitis externa, bilateral: Secondary | ICD-10-CM

## 2019-11-25 DIAGNOSIS — H66003 Acute suppurative otitis media without spontaneous rupture of ear drum, bilateral: Secondary | ICD-10-CM | POA: Diagnosis not present

## 2019-11-25 MED ORDER — AMOXICILLIN 500 MG PO CAPS: 500.0000 mg | ORAL_CAPSULE | Freq: Two times a day (BID) | ORAL | 0 refills | Status: AC

## 2019-11-25 MED ORDER — NEOMYCIN-POLYMYXIN-HC 3.5-10000-1 OT SUSP: 4.0000 [drp] | Freq: Three times a day (TID) | OTIC | 0 refills | Status: AC

## 2019-11-25 NOTE — ED Triage Notes (Signed)
Pt reports bilateral earache x 2 days.  R worse than left.

## 2019-11-25 NOTE — Discharge Instructions (Addendum)
Rest and drink plenty of fluids Amoxicillin prescribed.  Take as directed and to completion Cortisporin ear drops prescribed.  Use as directed and to completion Alternate OTC ibuprofen and/ or tylenol as needed for pain control Follow up with PCP if symptoms persists Return here or go to the ER if you have any new or worsening symptoms fever, chills, nausea, vomiting, ear pain, persistent symptoms despite medications, etc..Marland Kitchen

## 2019-11-25 NOTE — ED Provider Notes (Addendum)
[facility]   [id number] 11/25/19 Arrival Time: 1132  CC: EAR PAIN  SUBJECTIVE: History from: patient.  Levi Kim is a 64 y.o. male who presents with of bilateral R>L ear pain for the past 1 day.  Admits to recent treatment for fungal infection in the ears.  Patient states the pain is constant and "pain" in character.  Denies alleviating factors.  Symptoms are made worse with pulling ears.  Reports similar symptoms in the past that improved with antibiotic and ear drops.    Denies fever, chills, fatigue, sinus pain, rhinorrhea, ear discharge, sore throat, SOB, wheezing, chest pain, nausea, changes in bowel or bladder habits.    ROS: As per HPI.  All other pertinent ROS negative.     Past Medical History:  Diagnosis Date  . Alpha galactosidase deficiency   . Arthritis   . Asthma   . High cholesterol    Past Surgical History:  Procedure Laterality Date  . APPENDECTOMY    . BACK SURGERY    . Fistula Repair    . HERNIA REPAIR    . JOINT REPLACEMENT  2017  . SHOULDER SURGERY    . SINUS EXPLORATION    . testical removal    . TONSILLECTOMY     Allergies  Allergen Reactions  . Aspirin Anaphylaxis  . Augmentin [Amoxicillin-Pot Clavulanate] Other (See Comments)    Patient states has caused GI bleed  . Ciprofloxacin Other (See Comments)    States has caused severe achilles pain but no tear  . Morphine And Related Anaphylaxis    Pt allergic to all narcotic pain meds per pt  . Opium Anaphylaxis    NOT PO!!! (IV USE ONLY)  . Prednisone Swelling    Patient states severe swelling with prednisone. Per patient, has previously tolerated methylprednisolone   . Sulfa Antibiotics Shortness Of Breath    Patient says supposed to stay away due to breathing problems   No current facility-administered medications on file prior to encounter.   Current Outpatient Medications on File Prior to Encounter  Medication Sig Dispense Refill  . acetaminophen (TYLENOL) 500 MG tablet  Take 1,500 mg by mouth 3 (three) times daily as needed. For pain. **takes only two tablets as needed for headaches**    . albuterol (PROVENTIL HFA) 108 (90 BASE) MCG/ACT inhaler Inhale 2 puffs into the lungs daily as needed.    Marland Kitchen alfuzosin (UROXATRAL) 10 MG 24 hr tablet Take 1 tablet by mouth daily.    Marland Kitchen docusate sodium (COLACE) 100 MG capsule Take 100 mg by mouth daily as needed for mild constipation.    Marland Kitchen levocetirizine (XYZAL) 5 MG tablet Take 5 mg by mouth every evening.    . simvastatin (ZOCOR) 20 MG tablet Take 10 mg by mouth at bedtime.    . traZODone (DESYREL) 50 MG tablet Take 50 mg by mouth at bedtime.     Social History   Socioeconomic History  . Marital status: Married    Spouse name: Not on file  . Number of children: Not on file  . Years of education: Not on file  . Highest education level: Not on file  Occupational History  . Not on file  Tobacco Use  . Smoking status: Former Games developer  . Smokeless tobacco: Never Used  Substance and Sexual Activity  . Alcohol use: No  . Drug use: No  . Sexual activity: Not on file  Other Topics Concern  . Not on file  Social History Narrative  .  Not on file   Social Determinants of Health   Financial Resource Strain:   . Difficulty of Paying Living Expenses:   Food Insecurity:   . Worried About Charity fundraiser in the Last Year:   . Arboriculturist in the Last Year:   Transportation Needs:   . Film/video editor (Medical):   Marland Kitchen Lack of Transportation (Non-Medical):   Physical Activity:   . Days of Exercise per Week:   . Minutes of Exercise per Session:   Stress:   . Feeling of Stress :   Social Connections:   . Frequency of Communication with Friends and Family:   . Frequency of Social Gatherings with Friends and Family:   . Attends Religious Services:   . Active Member of Clubs or Organizations:   . Attends Archivist Meetings:   Marland Kitchen Marital Status:   Intimate Partner Violence:   . Fear of Current or  Ex-Partner:   . Emotionally Abused:   Marland Kitchen Physically Abused:   . Sexually Abused:    No family history on file.  OBJECTIVE:  Vitals:   11/25/19 1145 11/25/19 1216  BP: 122/77   Pulse: 81   Resp: 16   Temp: 97.9 F (36.6 C)   TempSrc: Oral   SpO2: 95%   Weight:  170 lb (77.1 kg)  Height:  5\' 9"  (1.753 m)     General appearance: alert; well-appearing, nontoxic; speaking in full sentences and tolerating own secretions HEENT: NCAT; Ears: EACs appear erythematous and swollen, R>L, TMs also appear erythematous, + tragal tenderness; Eyes: PERRL.  EOM grossly intact.Nose: nares patent without rhinorrhea, Throat: oropharynx clear, tonsils non erythematous or enlarged, uvula midline  Neck: supple without LAD Lungs: unlabored respirations, symmetrical air entry; cough: absent; no respiratory distress; CTAB Heart: regular rate and rhythm.  Skin: warm and dry Psychological: alert and cooperative; normal mood and affect    ASSESSMENT & PLAN:  1. Non-recurrent acute suppurative otitis media of both ears without spontaneous rupture of tympanic membranes   2. Infective otitis externa, bilateral   3. Ear pain, bilateral     Meds ordered this encounter  Medications  . amoxicillin (AMOXIL) 500 MG capsule    Sig: Take 1 capsule (500 mg total) by mouth 2 (two) times daily for 10 days.    Dispense:  20 capsule    Refill:  0    Order Specific Question:   Supervising Provider    Answer:   Raylene Everts [[id number]]  . neomycin-polymyxin-hydrocortisone (CORTISPORIN) 3.5-10000-1 OTIC suspension    Sig: Place 4 drops into both ears 3 (three) times daily for 10 days.    Dispense:  10 mL    Refill:  0    Order Specific Question:   Supervising Provider    Answer:   Raylene Everts [[id number]]    Rest and drink plenty of fluids Amoxicillin prescribed.  Take as directed and to completion Cortisporin ear drops prescribed.  Use as directed and to completion Alternate OTC ibuprofen and/ or  tylenol as needed for pain control Follow up with PCP if symptoms persists Return here or go to the ER if you have any new or worsening symptoms fever, chills, nausea, vomiting, ear pain, persistent symptoms despite medications, etc...  Reviewed expectations re: course of current medical issues. Questions answered. Outlined signs and symptoms indicating need for more acute intervention. Patient verbalized understanding. After Visit Summary given.         [place], [place],  PA-C 11/25/19 1214    Alvino Chapel Cecilia, PA-C 11/25/19 1320

## 2020-01-19 DIAGNOSIS — H9113 Presbycusis, bilateral: Secondary | ICD-10-CM | POA: Insufficient documentation

## 2020-05-31 ENCOUNTER — Encounter: Payer: Self-pay | Admitting: Emergency Medicine

## 2020-05-31 ENCOUNTER — Ambulatory Visit
Admission: EM | Admit: 2020-05-31 | Discharge: 2020-05-31 | Disposition: A | Discharge status: Home / Self Care | Payer: No Typology Code available for payment source | Attending: Emergency Medicine | Admitting: Emergency Medicine

## 2020-05-31 DIAGNOSIS — M778 Other enthesopathies, not elsewhere classified: Secondary | ICD-10-CM

## 2020-05-31 MED ORDER — COLCHICINE 0.6 MG PO TABS: 0.6000 mg | ORAL_TABLET | Freq: Every day | ORAL | 0 refills | Status: DC

## 2020-05-31 NOTE — Discharge Instructions (Signed)
Continue to take OTC Tylenol as needed for pain Continue to use Voltaren gel as needed for pain Colchicine was prescribed for inflammation  Follow RICE instruction days attached follow-up with PCP Return or go to ED for worsening of symptoms

## 2020-05-31 NOTE — ED Provider Notes (Signed)
[facility]   [id number] 05/31/20 Arrival Time: 0844   Chief Complaint  Patient presents with  . Hand Pain     SUBJECTIVE: History from: patient.  Levi Kim is a 64 y.o. male presented to the urgent care with a complaint of left hand  pain with movement for the past 3 to 4 days.  Denies any precipitating event, trauma or injury.  He localizes the pain to the left thumb.  He describes the pain as constant and achy.  He has tried Tylenol and Voltaren gel with mild relief.  His symptoms are made worse with ROM.  He denies similar symptoms in the past.  Denies chills, fever, nausea, vomiting, diarrhea, diplopia, blurry vision, paresthesia, LOC.   ROS: As per HPI.  All other pertinent ROS negative.      Past Medical History:  Diagnosis Date  . Alpha galactosidase deficiency   . Arthritis   . Asthma   . High cholesterol    Past Surgical History:  Procedure Laterality Date  . APPENDECTOMY    . BACK SURGERY    . Fistula Repair    . HERNIA REPAIR    . JOINT REPLACEMENT  2017  . SHOULDER SURGERY    . SINUS EXPLORATION    . testical removal    . TONSILLECTOMY     Allergies  Allergen Reactions  . Aspirin Anaphylaxis  . Augmentin [Amoxicillin-Pot Clavulanate] Other (See Comments)    Patient states has caused GI bleed  . Ciprofloxacin Other (See Comments)    States has caused severe achilles pain but no tear  . Morphine And Related Anaphylaxis    Pt allergic to all narcotic pain meds per pt  . Opium Anaphylaxis    NOT PO!!! (IV USE ONLY)  . Prednisone Swelling    Patient states severe swelling with prednisone. Per patient, has previously tolerated methylprednisolone   . Sulfa Antibiotics Shortness Of Breath    Patient says supposed to stay away due to breathing problems   No current facility-administered medications on file prior to encounter.   Current Outpatient Medications on File Prior to Encounter  Medication Sig Dispense Refill  . acetaminophen  (TYLENOL) 500 MG tablet Take 1,500 mg by mouth 3 (three) times daily as needed. For pain. **takes only two tablets as needed for headaches**    . albuterol (PROVENTIL HFA) 108 (90 BASE) MCG/ACT inhaler Inhale 2 puffs into the lungs daily as needed.    Marland Kitchen alfuzosin (UROXATRAL) 10 MG 24 hr tablet Take 1 tablet by mouth daily.    Marland Kitchen docusate sodium (COLACE) 100 MG capsule Take 100 mg by mouth daily as needed for mild constipation.    Marland Kitchen levocetirizine (XYZAL) 5 MG tablet Take 5 mg by mouth every evening.    . simvastatin (ZOCOR) 20 MG tablet Take 10 mg by mouth at bedtime.    . traZODone (DESYREL) 50 MG tablet Take 50 mg by mouth at bedtime.     Social History   Socioeconomic History  . Marital status: Married    Spouse name: Not on file  . Number of children: Not on file  . Years of education: Not on file  . Highest education level: Not on file  Occupational History  . Not on file  Tobacco Use  . Smoking status: Former Games developer  . Smokeless tobacco: Never Used  Vaping Use  . Vaping Use: Never used  Substance and Sexual Activity  . Alcohol use: No  . Drug use: No  .  Sexual activity: Not on file  Other Topics Concern  . Not on file  Social History Narrative  . Not on file   Social Determinants of Health   Financial Resource Strain:   . Difficulty of Paying Living Expenses: Not on file  Food Insecurity:   . Worried About Programme researcher, broadcasting/film/video in the Last Year: Not on file  . Ran Out of Food in the Last Year: Not on file  Transportation Needs:   . Lack of Transportation (Medical): Not on file  . Lack of Transportation (Non-Medical): Not on file  Physical Activity:   . Days of Exercise per Week: Not on file  . Minutes of Exercise per Session: Not on file  Stress:   . Feeling of Stress : Not on file  Social Connections:   . Frequency of Communication with Friends and Family: Not on file  . Frequency of Social Gatherings with Friends and Family: Not on file  . Attends Religious  Services: Not on file  . Active Member of Clubs or Organizations: Not on file  . Attends Banker Meetings: Not on file  . Marital Status: Not on file  Intimate Partner Violence:   . Fear of Current or Ex-Partner: Not on file  . Emotionally Abused: Not on file  . Physically Abused: Not on file  . Sexually Abused: Not on file   No family history on file.  OBJECTIVE:  Vitals:   05/31/20 0858 05/31/20 0859  BP:  117/74  Pulse:  76  Resp:  19  Temp:  98.1 F (36.7 C)  TempSrc:  Oral  SpO2:  98%  Weight: 170 lb (77.1 kg)   Height: 5\' 9"  (1.753 m)      Physical Exam Vitals and nursing note reviewed.  Constitutional:      General: He is not in acute distress.    Appearance: Normal appearance. He is normal weight. He is not ill-appearing, toxic-appearing or diaphoretic.  Cardiovascular:     Rate and Rhythm: Normal rate and regular rhythm.     Pulses: Normal pulses.     Heart sounds: Normal heart sounds. No murmur heard.  No friction rub. No gallop.   Pulmonary:     Effort: Pulmonary effort is normal. No respiratory distress.     Breath sounds: Normal breath sounds. No stridor. No wheezing, rhonchi or rales.  Chest:     Chest wall: No tenderness.  Musculoskeletal:        General: Tenderness present.     Right hand: Normal.     Left hand: Tenderness present.     Comments: The left hand is without any obvious asymmetry or deformity when compared to the right hand.  There is no ecchymosis, open wound, lesion, warmth, subungual hematoma or surface trauma present.  Normal range of motion on left arm with pain.  Pain with palpation on the radial border.  Neurovascular status intact.  Neurological:     Mental Status: He is alert and oriented to person, place, and time.     LABS:  No results found for this or any previous visit (from the past 24 hour(s)).   ASSESSMENT & PLAN:  1. Tendinitis of thumb     Meds ordered this encounter  Medications  . colchicine  0.6 MG tablet    Sig: Take 1 tablet (0.6 mg total) by mouth daily for 5 days.    Dispense:  5 tablet    Refill:  0   Patient is  stable at discharge.  Prednisone cannot be prescribed patient is unable to take prednisone.  Instead colchicine was prescribed to help with inflammation.     Discharge instructions  Continue to take OTC Tylenol as needed for pain Continue to use Voltaren gel as needed for pain Colchicine was prescribed for inflammation  Follow RICE instruction days attached follow-up with PCP Return or go to ED for worsening of symptoms  Reviewed expectations re: course of current medical issues. Questions answered. Outlined signs and symptoms indicating need for more acute intervention. Patient verbalized understanding. After Visit Summary given.         Durward Parcel, FNP 05/31/20 [phone]

## 2020-05-31 NOTE — ED Triage Notes (Signed)
Pain to base of LT thumb area with certain movements for past 3-4 days. Denies any injuries.

## 2020-07-08 ENCOUNTER — Encounter: Payer: Self-pay | Admitting: Internal Medicine

## 2020-07-15 ENCOUNTER — Other Ambulatory Visit: Payer: Self-pay

## 2020-07-15 ENCOUNTER — Ambulatory Visit
Admission: EM | Admit: 2020-07-15 | Discharge: 2020-07-15 | Disposition: A | Discharge status: Home / Self Care | Payer: No Typology Code available for payment source | Attending: Internal Medicine | Admitting: Internal Medicine

## 2020-07-15 DIAGNOSIS — M542 Cervicalgia: Secondary | ICD-10-CM

## 2020-07-15 NOTE — ED Provider Notes (Signed)
[facility]    CSN: [id number] Arrival date & time: 07/15/20  1311      History   Chief Complaint Chief Complaint  Patient presents with  . Neck Pain    HPI Levi Kim is a 64 y.o. male comes to the urgent care with few days history of left-sided neck and back pain.  Patient was hanging some Christmas decorations when the pain started.  Pain is sharp, starts from the neck and radiates to the shoulder and the anterior chest wall.  No chest pressure.  No known aggravating factors.  No known relieving factors.  Patient has degenerative disc disease in the neck.  No nausea, vomiting or diarrhea.   HPI  Past Medical History:  Diagnosis Date  . Alpha galactosidase deficiency   . Arthritis   . Asthma   . High cholesterol     There are no problems to display for this patient.   Past Surgical History:  Procedure Laterality Date  . APPENDECTOMY    . BACK SURGERY    . Fistula Repair    . HERNIA REPAIR    . JOINT REPLACEMENT  2017  . SHOULDER SURGERY    . SINUS EXPLORATION    . testical removal    . TONSILLECTOMY         Home Medications    Prior to Admission medications   Medication Sig Start Date End Date Taking? Authorizing Provider  acetaminophen (TYLENOL) 500 MG tablet Take 1,500 mg by mouth 3 (three) times daily as needed. For pain. **takes only two tablets as needed for headaches**    [provider]  albuterol (PROVENTIL HFA) 108 (90 BASE) MCG/ACT inhaler Inhale 2 puffs into the lungs daily as needed.    [provider]  alfuzosin (UROXATRAL) 10 MG 24 hr tablet Take 1 tablet by mouth daily. 12/21/15   [provider]  colchicine 0.6 MG tablet Take 1 tablet (0.6 mg total) by mouth daily for 5 days. 05/31/20 06/05/20  Avegno, Zachery Dakins, FNP  docusate sodium (COLACE) 100 MG capsule Take 100 mg by mouth daily as needed for mild constipation.    [provider]  levocetirizine (XYZAL) 5 MG tablet Take 5 mg by mouth  every evening.    [provider]  simvastatin (ZOCOR) 20 MG tablet Take 10 mg by mouth at bedtime. 11/30/15   [provider]  traZODone (DESYREL) 50 MG tablet Take 50 mg by mouth at bedtime.    [provider]    Family History Family History  Family history unknown: Yes    Social History Social History   Tobacco Use  . Smoking status: Former Games developer  . Smokeless tobacco: Never Used  Vaping Use  . Vaping Use: Never used  Substance Use Topics  . Alcohol use: No  . Drug use: No     Allergies   Aspirin, Augmentin [amoxicillin-pot clavulanate], Ciprofloxacin, Morphine and related, Opium, Prednisone, and Sulfa antibiotics   Review of Systems Review of Systems  Constitutional: Negative.   HENT: Negative.   Musculoskeletal: Positive for neck pain and neck stiffness.  Skin: Negative.   Neurological: Negative for headaches.     Physical Exam Triage Vital Signs ED Triage Vitals  Enc Vitals Group     BP 07/15/20 1317 125/78     Pulse Rate 07/15/20 1317 66     Resp 07/15/20 1317 16     Temp 07/15/20 1317 98.1 F (36.7 C)     Temp  Source 07/15/20 1317 Tympanic     SpO2 07/15/20 1317 94 %     Weight --      Height --      Head Circumference --      Peak Flow --      Pain Score 07/15/20 1326 6     Pain Loc --      Pain Edu? --      Excl. in GC? --    No data found.  Updated Vital Signs BP 125/78 (BP Location: Right Arm)   Pulse 66   Temp 98.1 F (36.7 C) (Tympanic)   Resp 16   SpO2 94%   Visual Acuity Right Eye Distance:   Left Eye Distance:   Bilateral Distance:    Right Eye Near:   Left Eye Near:    Bilateral Near:     Physical Exam Vitals and nursing note reviewed.  Constitutional:      General: He is not in acute distress.    Appearance: He is not ill-appearing or diaphoretic.  Cardiovascular:     Rate and Rhythm: Normal rate and regular rhythm.     Pulses: Normal pulses.     Heart sounds: Normal heart sounds.   Pulmonary:     Effort: Pulmonary effort is normal.     Breath sounds: Normal breath sounds.  Musculoskeletal:        General: No swelling or tenderness. Normal range of motion.  Neurological:     General: No focal deficit present.     Mental Status: He is alert and oriented to person, place, and time.      UC Treatments / Results  Labs (all labs ordered are listed, but only abnormal results are displayed) Labs Reviewed - No data to display  EKG   Radiology No results found.  Procedures Procedures (including critical care time)  Medications Ordered in UC Medications - No data to display  Initial Impression / Assessment and Plan / UC Course  I have reviewed the triage vital signs and the nursing notes.  Pertinent labs & imaging results that were available during my care of the patient were reviewed by me and considered in my medical decision making (see chart for details).     1.  Neck pain: EKG shows normal sinus rhythm.  This was done because patient said he had some dull pressure associated with the neck pain.  He is not interested in going to the emergency department. Heating heat therapy recommended.  Tylenol as needed for pain.  Return precautions given. Final Clinical Impressions(s) / UC Diagnoses   Final diagnoses:  Neck pain on left side     Discharge Instructions     Gentle range of motion exercises Heating pad use If symptoms worsens-please return to urgent care to be re-evaluated. If chest pain,chest pressure, shortness of breath and dizziness-please return to ED immediately for further evaluation.   ED Prescriptions    None     PDMP not reviewed this encounter.   Merrilee Jansky, MD 07/15/20 [phone]

## 2020-07-15 NOTE — ED Triage Notes (Signed)
Pt presents with neck and upper back pain for past few days

## 2020-07-15 NOTE — Discharge Instructions (Signed)
Gentle range of motion exercises Heating pad use If symptoms worsens-please return to urgent care to be re-evaluated. If chest pain,chest pressure, shortness of breath and dizziness-please return to ED immediately for further evaluation.

## 2020-08-20 ENCOUNTER — Ambulatory Visit: Payer: No Typology Code available for payment source | Admitting: Gastroenterology

## 2020-08-24 NOTE — Progress Notes (Signed)
Referring Provider: [facility] Primary Care Physician:  [facility] Primary Gastroenterologist:  Dr. Jena Gauss  Chief Complaint  Patient presents with  . change in bowels    Was on abx and afterwards had change in stools. Stools are finally back to normal now after eating yogurt. Last TCS 6-7 years ago    HPI:   Levi Kim is a 65 y.o. male presenting today at the request of [facility] due to IBS and change in bowel habits.  Today:  Took antibiotics for sinus infection around 4-6 months ago. After this, he had green colored stool. Also had some hemorrhoid irritation with toilet tissue hematochezia. This is chronic and intermittent. No melena. Started eating yogurt and symptoms resolved after 10 days. Also had some stomach cramping. Never had diarrhea. No history of constipation. Bowels are moving very well at this time and are soft and formed. States he has had pre-cancerous polyps and is 6-7 years from his last colonoscopy. Last colonoscopy was at [facility]. Concerned about having a colonoscopy with COVID pandemic. States he has had 6-7 surgeries on his sinuses and is limited on his ability for COVID testing. 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   Has lost weight secondary to dietary changes. Eats mostly vegetable. Highest weight around 218 about 3 years ago. Currently weight is stable in the mid 170s.    No nausea, vomiting, GERD, or dysphagia.    Past Medical History:  Diagnosis Date  . Alpha galactosidase deficiency   . Arthritis   . Arthritis   . Asthma   . High cholesterol     Past Surgical History:  Procedure Laterality Date  . APPENDECTOMY    . BACK SURGERY    . Fistula Repair    . HERNIA REPAIR    . JOINT REPLACEMENT  2017  . SHOULDER SURGERY    . SINUS EXPLORATION    . testical removal    . TONSILLECTOMY      Current Outpatient Medications  Medication Sig Dispense Refill  .  acetaminophen (TYLENOL) 500 MG tablet Take 1,500 mg by mouth 3 (three) times daily as needed. For pain. **takes only two tablets as needed for headaches**    . albuterol (VENTOLIN HFA) 108 (90 Base) MCG/ACT inhaler Inhale 2 puffs into the lungs daily as needed.    Marland Kitchen alfuzosin (UROXATRAL) 10 MG 24 hr tablet Take 1 tablet by mouth daily.    Marland Kitchen levocetirizine (XYZAL) 5 MG tablet Take 5 mg by mouth every evening.    . traZODone (DESYREL) 50 MG tablet Take 25 mg by mouth at bedtime.     No current facility-administered medications for this visit.    Allergies as of 08/25/2020 - Review Complete 08/25/2020  Allergen Reaction Noted  . Aspirin Anaphylaxis 11/15/2011  . Augmentin [amoxicillin-pot clavulanate] Other (See Comments) 07/28/2019  . Ciprofloxacin Other (See Comments) 07/28/2019  . Morphine and related Anaphylaxis 11/15/2011  . Opium Anaphylaxis 11/15/2011  . Prednisone Swelling 07/28/2019  . Sulfa antibiotics Shortness Of Breath 07/28/2019    Family History  Problem Relation Age of Onset  . Colon cancer Neg Hx     Social History   Socioeconomic History  . Marital status: Married    Spouse name: Not on file  . Number of children: Not on file  . Years of education: Not on file  . Highest education level: Not on file  Occupational History  .  Not on file  Tobacco Use  . Smoking status: Former Games developer  . Smokeless tobacco: Never Used  Vaping Use  . Vaping Use: Never used  Substance and Sexual Activity  . Alcohol use: No  . Drug use: No  . Sexual activity: Not on file  Other Topics Concern  . Not on file  Social History Narrative  . Not on file   Social Determinants of Health   Financial Resource Strain: Not on file  Food Insecurity: Not on file  Transportation Needs: Not on file  Physical Activity: Not on file  Stress: Not on file  Social Connections: Not on file  Intimate Partner Violence: Not on file    Review of Systems: Gen: Denies any fever, chills, cold or  flu like symptoms, pre-syncope, or syncope.  CV: Denies chest pain or palpitations. Resp: Denies shortness of breath or cough. GI: See HPI GU : Denies urinary burning, urinary frequency, urinary hesitancy MS: Chronic joint pain Derm: Denies rash Psych: Denies depression or anxiety.  Heme: See HPI  Physical Exam: BP 114/69   Pulse 72   Temp (!) 97 F (36.1 C)   Ht 5\' 9"  (1.753 m)   Wt 179 lb 9.6 oz (81.5 kg)   BMI 26.52 kg/m  General:   Alert and oriented. Pleasant and cooperative. Well-nourished and well-developed.  Head:  Normocephalic and atraumatic. Eyes:  Without icterus, sclera clear and conjunctiva pink.  Ears:  Normal auditory acuity. Lungs:  Clear to auscultation bilaterally. No wheezes, rales, or rhonchi. No distress.  Heart:  S1, S2 present without murmurs appreciated.  Abdomen:  +BS, soft, non-tender and non-distended. No HSM noted. No guarding or rebound. No masses appreciated.  Rectal:  Deferred  Msk:  Symmetrical without gross deformities. Normal posture. Extremities:  Without edema. Neurologic:  Alert and  oriented x4;  grossly normal neurologically. Skin:  Intact without significant lesions or rashes. Psych: Normal mood and affect.  Labs: 07/01/2020: WBC 5.94, hemoglobin 14.8, platelet count 231. Sodium 139, potassium 4.3, creatinine 0.792, glucose 91, total bilirubin 0.7, AST 14, ALT 18 Hemoglobin A1c 5.7 Total cholesterol, 203 triglycerides 69, HDL 64, LDL 125

## 2020-08-25 ENCOUNTER — Ambulatory Visit (INDEPENDENT_AMBULATORY_CARE_PROVIDER_SITE_OTHER): Payer: No Typology Code available for payment source | Admitting: Gastroenterology

## 2020-08-25 ENCOUNTER — Other Ambulatory Visit: Payer: Self-pay

## 2020-08-25 ENCOUNTER — Encounter: Payer: Self-pay | Admitting: Gastroenterology

## 2020-08-25 DIAGNOSIS — Z86010 Personal history of colonic polyps: Secondary | ICD-10-CM

## 2020-08-25 DIAGNOSIS — R195 Other fecal abnormalities: Secondary | ICD-10-CM

## 2020-08-25 NOTE — Patient Instructions (Signed)
We are requesting your colonoscopy records from [facility] and will reach out to you regarding scheduling a colonoscopy once I have reviewed the records.   If you haven't heard from [place] in 2-3 weeks, please reach back out to our office.   It was nice meeting you today!  Ermalinda Memos, PA-C [facility]

## 2020-08-25 NOTE — Assessment & Plan Note (Signed)
65 y.o. male presenting today due to change in stool color with associated abdominal cramping x10 days following a course of antibiotics about 4-6 months ago. Symptoms resolved after eating yogurt. Notes he has history of intermittent toilet tissue hematochezia in the setting of known hemorrhoids. Also reports history of pre-cancerous polyps on prior colonoscopies. Thinks his last colonoscopy was about 6-7 years ago at [facility]. No other significant GI concerns. He ha lost about 40 lbs over the last 3 years which he attributes to significant dietary changes related to his wife's illness. Primarily eating vegetables. Weight is currently stable in the mid 170s.   Plan:  Request colonoscopy records from [facility]. Further recommendations to follow.

## 2020-08-25 NOTE — Progress Notes (Signed)
Cc'ed to pcp °

## 2020-09-02 ENCOUNTER — Telehealth: Payer: Self-pay | Admitting: Gastroenterology

## 2020-09-02 NOTE — Telephone Encounter (Signed)
Following up on colonoscopy records with path from [facility]. I have not received any results. Did we request records?

## 2020-09-06 NOTE — Telephone Encounter (Signed)
I requested records the same day he was seen in the office

## 2020-09-18 ENCOUNTER — Ambulatory Visit: Payer: No Typology Code available for payment source

## 2020-09-18 ENCOUNTER — Other Ambulatory Visit: Payer: Self-pay

## 2020-09-18 ENCOUNTER — Ambulatory Visit (INDEPENDENT_AMBULATORY_CARE_PROVIDER_SITE_OTHER): Payer: No Typology Code available for payment source

## 2020-09-18 ENCOUNTER — Encounter: Payer: Self-pay | Admitting: Emergency Medicine

## 2020-09-18 ENCOUNTER — Ambulatory Visit
Admission: EM | Admit: 2020-09-18 | Discharge: 2020-09-18 | Disposition: A | Discharge status: Home / Self Care | Payer: No Typology Code available for payment source | Attending: Family Medicine | Admitting: Family Medicine

## 2020-09-18 DIAGNOSIS — M25551 Pain in right hip: Secondary | ICD-10-CM | POA: Diagnosis not present

## 2020-09-18 DIAGNOSIS — M545 Low back pain, unspecified: Secondary | ICD-10-CM | POA: Diagnosis not present

## 2020-09-18 NOTE — ED Triage Notes (Signed)
Right side lower back pain radiating down right leg.  No known injury

## 2020-09-18 NOTE — ED Provider Notes (Signed)
[facility]   [id number] 09/18/20 Arrival Time: 1100  VV:OHYWV PAIN  SUBJECTIVE: History from: patient. BARAN KUHRT is a 65 y.o. male complains of right low back and hip pain that began about a week ago. Reports that he tried to be seen at the [place] but they have not gotten back with him yet. He has a significant medical history including R total hip arthroplasty, low back spinal fusion. Denies a precipitating event or specific injury. Describes the pain as constant and achy in character with intermittent sharp pains. Has tried OTC medications without relief. Symptoms are made worse with activity. Reports similar symptoms in the past. Reports that he is to have MRI with the [place]. Denies fever, chills, erythema, ecchymosis, effusion, weakness, numbness and tingling, saddle paresthesias, loss of bowel or bladder function.      ROS: As per HPI.  All other pertinent ROS negative.     Past Medical History:  Diagnosis Date  . Alpha galactosidase deficiency   . Arthritis   . Arthritis   . Asthma   . High cholesterol    Past Surgical History:  Procedure Laterality Date  . APPENDECTOMY    . BACK SURGERY    . Fistula Repair    . HERNIA REPAIR    . JOINT REPLACEMENT  2017  . SHOULDER SURGERY    . SINUS EXPLORATION    . testical removal    . TONSILLECTOMY     Allergies  Allergen Reactions  . Aspirin Anaphylaxis  . Augmentin [Amoxicillin-Pot Clavulanate] Other (See Comments)    Patient states has caused GI bleed  . Ciprofloxacin Other (See Comments)    States has caused severe achilles pain but no tear  . Morphine And Related Anaphylaxis    Pt allergic to all narcotic pain meds per pt  . Opium Anaphylaxis    NOT PO!!! (IV USE ONLY)  . Prednisone Swelling    Patient states severe swelling with prednisone. Per patient, has previously tolerated methylprednisolone   . Sulfa Antibiotics Shortness Of Breath    Patient says supposed to stay away due to breathing problems   No  current facility-administered medications on file prior to encounter.   Current Outpatient Medications on File Prior to Encounter  Medication Sig Dispense Refill  . acetaminophen (TYLENOL) 500 MG tablet Take 1,500 mg by mouth 3 (three) times daily as needed. For pain. **takes only two tablets as needed for headaches**    . albuterol (VENTOLIN HFA) 108 (90 Base) MCG/ACT inhaler Inhale 2 puffs into the lungs daily as needed.    Marland Kitchen alfuzosin (UROXATRAL) 10 MG 24 hr tablet Take 1 tablet by mouth daily.    Marland Kitchen levocetirizine (XYZAL) 5 MG tablet Take 5 mg by mouth every evening.    . traZODone (DESYREL) 50 MG tablet Take 25 mg by mouth at bedtime.     Social History   Socioeconomic History  . Marital status: Married    Spouse name: Not on file  . Number of children: Not on file  . Years of education: Not on file  . Highest education level: Not on file  Occupational History  . Not on file  Tobacco Use  . Smoking status: Former Games developer  . Smokeless tobacco: Never Used  Vaping Use  . Vaping Use: Never used  Substance and Sexual Activity  . Alcohol use: No  . Drug use: No  . Sexual activity: Not on file  Other Topics Concern  . Not on file  Social History Narrative  . Not on file   Social Determinants of Health   Financial Resource Strain: Not on file  Food Insecurity: Not on file  Transportation Needs: Not on file  Physical Activity: Not on file  Stress: Not on file  Social Connections: Not on file  Intimate Partner Violence: Not on file   Family History  Problem Relation Age of Onset  . Colon cancer Neg Hx     OBJECTIVE:  Vitals:   09/18/20 1125  BP: (!) 167/75  Pulse: 71  Resp: 18  Temp: (!) 97.3 F (36.3 C)  TempSrc: Oral  SpO2: 95%    General appearance: ALERT; in no acute distress.  Head: NCAT Lungs: Normal respiratory effort CV: pulses 2+ bilaterally. Cap refill < 2 seconds Musculoskeletal:  Inspection: Skin warm, dry, clear and intact No erythema, no  effusion noted Palpation: R low back and R hip tender to palpation ROM: Limited ROM active and passive to R hip and back with bending, twisting and changing positions Skin: warm and dry Neurologic: Ambulates without difficulty; Sensation intact about the upper/ lower extremities Psychological: alert and cooperative; normal mood and affect  DIAGNOSTIC STUDIES:  No results found.   ASSESSMENT & PLAN:  1. Acute right-sided low back pain, unspecified whether sciatica present   2. Right hip pain    Xrays show no acute abnormalities or obvious cause for pain Continue conservative management of rest, ice, and gentle stretches Take tylenol as needed for pain relief (may cause abdominal discomfort, ulcers, and GI bleeds avoid taking with other NSAIDs) [facility] Printed xray reports given to patient Return or go to the ER if you have any new or worsening symptoms (fever, chills, chest pain, abdominal pain, changes in bowel or bladder habits, pain radiating into lower legs)   Reviewed expectations re: course of current medical issues. Questions answered. Outlined signs and symptoms indicating need for more acute intervention. Patient verbalized understanding. After Visit Summary given.       Moshe Cipro, NP 09/19/20 1351

## 2020-09-18 NOTE — Discharge Instructions (Addendum)
Xrays do not show acute abnormalities or cause for your pain  [facility]  Continue with tylenol and heat, gentle stretches  Follow up with this office or with primary care if symptoms are persisting.  Follow up in the ER for high fever, trouble swallowing, trouble breathing, other concerning symptoms.

## 2020-09-26 ENCOUNTER — Other Ambulatory Visit: Payer: Self-pay

## 2020-09-26 ENCOUNTER — Ambulatory Visit
Admission: EM | Admit: 2020-09-26 | Discharge: 2020-09-26 | Disposition: A | Discharge status: Home / Self Care | Payer: No Typology Code available for payment source | Attending: Emergency Medicine | Admitting: Emergency Medicine

## 2020-09-26 ENCOUNTER — Encounter: Payer: Self-pay | Admitting: Emergency Medicine

## 2020-09-26 DIAGNOSIS — M5412 Radiculopathy, cervical region: Secondary | ICD-10-CM

## 2020-09-26 DIAGNOSIS — J0100 Acute maxillary sinusitis, unspecified: Secondary | ICD-10-CM | POA: Diagnosis not present

## 2020-09-26 MED ORDER — AZITHROMYCIN 250 MG PO TABS: ORAL_TABLET | ORAL | 0 refills | Status: DC

## 2020-09-26 MED ORDER — CYCLOBENZAPRINE HCL 10 MG PO TABS: 10.0000 mg | ORAL_TABLET | Freq: Every day | ORAL | 0 refills | Status: DC

## 2020-09-26 NOTE — ED Provider Notes (Signed)
[facility]   [id number] 09/26/20 Arrival Time: 1305   CC: Neck pain, Sinusitis, arm numbness   SUBJECTIVE: History from: patient and family.  Levi Kim is a 65 y.o. male with history of degenerative disc disease presented to the urgent care for complaint of facial and hand numbness, pain tachycardic and sinus pressure and sinus pain for the past few days.  Denies any precipitating event.  Localized numbness to the left arm and hand and sinus pressure to maxillary sinus. He has tried OTC medications without relief.  Denies alleviating or aggravating factors.  Reports similar symptoms in the past.  Denies chills, fever, nausea, vomiting, diarrhea.  ROS: As per HPI.  All other pertinent ROS negative.     Past Medical History:  Diagnosis Date  . Alpha galactosidase deficiency   . Arthritis   . Arthritis   . Asthma   . High cholesterol    Past Surgical History:  Procedure Laterality Date  . APPENDECTOMY    . BACK SURGERY    . Fistula Repair    . HERNIA REPAIR    . JOINT REPLACEMENT  2017  . SHOULDER SURGERY    . SINUS EXPLORATION    . testical removal    . TONSILLECTOMY     Allergies  Allergen Reactions  . Aspirin Anaphylaxis  . Augmentin [Amoxicillin-Pot Clavulanate] Other (See Comments)    Patient states has caused GI bleed  . Ciprofloxacin Other (See Comments)    States has caused severe achilles pain but no tear  . Morphine And Related Anaphylaxis    Pt allergic to all narcotic pain meds per pt  . Opium Anaphylaxis    NOT PO!!! (IV USE ONLY)  . Prednisone Swelling    Patient states severe swelling with prednisone. Per patient, has previously tolerated methylprednisolone   . Sulfa Antibiotics Shortness Of Breath    Patient says supposed to stay away due to breathing problems  . Alpha-Gal    No current facility-administered medications on file prior to encounter.   Current Outpatient Medications on File Prior to Encounter  Medication Sig Dispense  Refill  . acetaminophen (TYLENOL) 500 MG tablet Take 1,500 mg by mouth 3 (three) times daily as needed. For pain. **takes only two tablets as needed for headaches**    . albuterol (VENTOLIN HFA) 108 (90 Base) MCG/ACT inhaler Inhale 2 puffs into the lungs daily as needed.    Marland Kitchen alfuzosin (UROXATRAL) 10 MG 24 hr tablet Take 1 tablet by mouth daily.    Marland Kitchen levocetirizine (XYZAL) 5 MG tablet Take 5 mg by mouth every evening.    . traZODone (DESYREL) 50 MG tablet Take 25 mg by mouth at bedtime.     Social History   Socioeconomic History  . Marital status: Married    Spouse name: Not on file  . Number of children: Not on file  . Years of education: Not on file  . Highest education level: Not on file  Occupational History  . Not on file  Tobacco Use  . Smoking status: Former Games developer  . Smokeless tobacco: Never Used  Vaping Use  . Vaping Use: Never used  Substance and Sexual Activity  . Alcohol use: No  . Drug use: No  . Sexual activity: Not on file  Other Topics Concern  . Not on file  Social History Narrative  . Not on file   Social Determinants of Health   Financial Resource Strain: Not on file  Food Insecurity: Not on  file  Transportation Needs: Not on file  Physical Activity: Not on file  Stress: Not on file  Social Connections: Not on file  Intimate Partner Violence: Not on file   Family History  Problem Relation Age of Onset  . Colon cancer Neg Hx     OBJECTIVE:  Vitals:   09/26/20 1331  BP: 136/73  Pulse: (!) 105  Resp: 18  Temp: 98.2 F (36.8 C)  TempSrc: Oral  SpO2: 95%     Physical Exam Vitals and nursing note reviewed.  Constitutional:      General: He is not in acute distress.    Appearance: Normal appearance. He is normal weight. He is not ill-appearing, toxic-appearing or diaphoretic.  HENT:     Nose:     Right Sinus: Maxillary sinus tenderness present.     Left Sinus: Maxillary sinus tenderness present.  Cardiovascular:     Rate and Rhythm:  Normal rate and regular rhythm.     Pulses: Normal pulses.     Heart sounds: Normal heart sounds. No murmur heard. No friction rub. No gallop.   Pulmonary:     Effort: Pulmonary effort is normal. No respiratory distress.     Breath sounds: Normal breath sounds. No stridor. No wheezing, rhonchi or rales.  Chest:     Chest wall: No tenderness.  Musculoskeletal:     Cervical back: Spasms and tenderness present.  Neurological:     General: No focal deficit present.     Mental Status: He is alert and oriented to person, place, and time.     GCS: GCS eye subscore is 4. GCS verbal subscore is 5. GCS motor subscore is 6.     Cranial Nerves: Cranial nerves are intact.     Sensory: Sensation is intact.     Motor: Motor function is intact.     Coordination: Coordination is intact.     Gait: Gait is intact.     LABS:  No results found for this or any previous visit (from the past 24 hour(s)).   ASSESSMENT & PLAN:  1. Cervical radiculopathy   2. Acute non-recurrent maxillary sinusitis     Meds ordered this encounter  Medications  . cyclobenzaprine (FLEXERIL) 10 MG tablet    Sig: Take 1 tablet (10 mg total) by mouth at bedtime.    Dispense:  30 tablet    Refill:  0  . azithromycin (ZITHROMAX Z-PAK) 250 MG tablet    Sig: Take 2 tablet (500 mg ) by mouth on day 1, then 1 tablet (250 mg) by mouth for the next 4 days    Dispense:  6 tablet    Refill:  0    Discharge instructions  Get plenty of rest and push fluids Prescribe azithromycin acute sinusitis Prescribed Flexeril for cervical radiculopathy Follow-up with PCP Use medications daily for symptom relief Use OTC medications like ibuprofen or tylenol as needed fever or pain Call or go to the ED if you have any new or worsening symptoms such as fever, worsening cough, shortness of breath, chest tightness, chest pain, facial droop, facial numbness, blurry vision, double vision, loss of bowel bladder control, projectile vomiting,  changes in mental status, etc...   Reviewed expectations re: course of current medical issues. Questions answered. Outlined signs and symptoms indicating need for more acute intervention. Patient verbalized understanding. After Visit Summary given.  Cervical radiculopathy       Durward Parcel, FNP 09/26/20 1409

## 2020-09-26 NOTE — Discharge Instructions (Signed)
Get plenty of rest and push fluids Prescribe azithromycin acute sinusitis Prescribed Flexeril for cervical radiculopathy Follow-up with PCP Use medications daily for symptom relief Use OTC medications like ibuprofen or tylenol as needed fever or pain Call or go to the ED if you have any new or worsening symptoms such as fever, worsening cough, shortness of breath, chest tightness, chest pain, facial droop, facial numbness, blurry vision, double vision, loss of bowel bladder control, projectile vomiting, changes in mental status, etc..Marland Kitchen

## 2020-09-26 NOTE — ED Triage Notes (Signed)
Pain to LT mid back that radiates around to his LT side.

## 2020-09-26 NOTE — Telephone Encounter (Signed)
I still have not received records. Can we request again?

## 2020-09-28 NOTE — Telephone Encounter (Signed)
I have faxed request on 08/26/2020, 09/08/2020 and again today. It was marked as ASAP and now changed to STAT.

## 2020-10-17 NOTE — Telephone Encounter (Signed)
Received and reviewed colonoscopy records from the [place].  Procedure date: 08/23/2011. Indication: Screening for personal history of colon polyps.  1 cm tubular adenoma 2009. Summary: 3 diminutive polyps were found in the sigmoid colon; all polyps removed by cold biopsy polypectomy. A diverticulum was found in the sigmoid colon. Hemorrhoids found. Recommendations: Colonoscopy recommended in 3 years if all polyps are adenomatous, otherwise repeat colonoscopy in 5 years given prior history of high risk adenoma.  Pathology: Tubular adenomas.  Please let patient know that we have received his colonoscopy records from the [place].  Last colonoscopy was in 2013.  He is due for repeat colonoscopy at this time.  [facility] clinical pool: Please arrange colonoscopy with propofol with Dr. Jena Gauss. ASA II. Dx: History of adenomatous colon polyps.

## 2020-10-18 ENCOUNTER — Telehealth: Payer: Self-pay | Admitting: Internal Medicine

## 2020-10-18 MED ORDER — CLENPIQ 10-3.5-12 MG-GM -GM/160ML PO SOLN: 1.0000 | Freq: Once | ORAL | 0 refills | Status: AC

## 2020-10-18 NOTE — Telephone Encounter (Signed)
PATIENT CALLED AND SAID THAT THE CLENPIQ PRESCRIPTION  NEEDS TO BE ELECRONICALLY SENT TO [facility]

## 2020-10-18 NOTE — Telephone Encounter (Signed)
Rx sent in

## 2020-10-18 NOTE — Addendum Note (Signed)
Addended by: Armstead Peaks on: 10/18/2020 09:36 AM   Modules accepted: Orders

## 2020-10-18 NOTE — Telephone Encounter (Addendum)
Called pt to schedule procedure. He has been scheduled for 5/26, pm appt (pt requests). He is aware will mail prep instructions with covid test appt. Also states he did not want the "gallon jug". He wants something that is easy to drink and don't have to drink a lot of mess. Advised will send in clenpiq but will need to check with pharmacy regarding cost. He states to send to [facility]. Rx sent.

## 2020-10-18 NOTE — Telephone Encounter (Signed)
Spoke with pt. Pt was notified that Ermalinda Memos, PA has reviewed his TCS results from the [place]. Pt is aware tht he is due for a TCS at this time.   Routing to [facility] Clinical Pool to arrange TCS.

## 2020-10-18 NOTE — Addendum Note (Signed)
Addended by: Armstead Peaks on: 10/18/2020 10:07 AM   Modules accepted: Orders

## 2020-10-21 ENCOUNTER — Telehealth: Payer: Self-pay | Admitting: *Deleted

## 2020-10-21 NOTE — Telephone Encounter (Signed)
Received fax from [place] that they will not cover clenpiq. Suggested alternative is moviprep/golytely.  Called pt and he refused the alternatives. He said he is calling the [place] himself. I advised pt if something changes to let me know.  I called Tasha at the [place] [phone] ext [phone] and advised her of patient response. She reports he has no option as they don't have the other preps. I advised he said he would be calling them.

## 2020-12-14 ENCOUNTER — Telehealth: Payer: Self-pay

## 2020-12-14 NOTE — Telephone Encounter (Signed)
Pt called office and LMOVM requesting we contact [facility] for them to approve for him to have Clenpiq prep. Also states he can't do covid test before TCS scheduled for 12/23/20 because every time something is stuck up his nose he gets a sinus infection.  [facility] auth expires 01/02/21. Last OV was 07/2020. Per previous phone note, Mindy spoke to someone at [place] about prep and they only carry Tri-Lyte.  Called pt, informed him the hospital will not do procedure without him having covid test. Pt requested to cancel procedure. Informed him if he decides later to have procedure he would need to f/u with [facility] to get new auth since current auth expires 01/02/21. Informed endo scheduler to cancel procedure.   FYI to [employer] PA.

## 2020-12-14 NOTE — Telephone Encounter (Signed)
Noted  

## 2020-12-20 NOTE — Telephone Encounter (Signed)
Bridgette, nurse navigator at [facility], LMOVM that pt is unable to do nasal covid test but would do a blood test for colonoscopy. He's also requesting Clenpiq but the [facility] doesn't carry Clenpiq. [phone]  Tried to call Bridgette back, LMOVM to inform her TCS for 12/23/20 had been cancelled d/t pt refused covid test. [facility] auth expires 01/02/21 and he was last seen in office 07/2020. He will need new auth to be seen in office prior to rescheduling TCS.

## 2020-12-21 ENCOUNTER — Other Ambulatory Visit (HOSPITAL_COMMUNITY): Payer: Medicare HMO

## 2020-12-23 ENCOUNTER — Ambulatory Visit (HOSPITAL_COMMUNITY)
Admission: RE | Admit: 2020-12-23 | Payer: No Typology Code available for payment source | Source: Home / Self Care | Admitting: Internal Medicine

## 2020-12-23 ENCOUNTER — Encounter (HOSPITAL_COMMUNITY): Admission: RE | Payer: Self-pay | Source: Home / Self Care

## 2020-12-23 SURGERY — COLONOSCOPY WITH PROPOFOL
Anesthesia: Monitor Anesthesia Care

## 2021-03-29 ENCOUNTER — Ambulatory Visit: Payer: No Typology Code available for payment source | Admitting: Orthopaedic Surgery

## 2021-04-12 ENCOUNTER — Ambulatory Visit: Payer: No Typology Code available for payment source

## 2021-04-12 ENCOUNTER — Telehealth: Payer: Self-pay | Admitting: Orthopaedic Surgery

## 2021-04-12 ENCOUNTER — Encounter: Payer: Self-pay | Admitting: Orthopaedic Surgery

## 2021-04-12 ENCOUNTER — Ambulatory Visit (INDEPENDENT_AMBULATORY_CARE_PROVIDER_SITE_OTHER): Payer: No Typology Code available for payment source | Admitting: Orthopaedic Surgery

## 2021-04-12 ENCOUNTER — Other Ambulatory Visit: Payer: Self-pay

## 2021-04-12 VITALS — BP 142/83 | HR 75 | Ht 70.0 in | Wt 176.0 lb

## 2021-04-12 DIAGNOSIS — M79645 Pain in left finger(s): Secondary | ICD-10-CM | POA: Insufficient documentation

## 2021-04-12 DIAGNOSIS — Z96649 Presence of unspecified artificial hip joint: Secondary | ICD-10-CM | POA: Insufficient documentation

## 2021-04-12 DIAGNOSIS — M25549 Pain in joints of unspecified hand: Secondary | ICD-10-CM | POA: Insufficient documentation

## 2021-04-12 DIAGNOSIS — M2550 Pain in unspecified joint: Secondary | ICD-10-CM

## 2021-04-12 DIAGNOSIS — G8929 Other chronic pain: Secondary | ICD-10-CM

## 2021-04-12 DIAGNOSIS — Z8739 Personal history of other diseases of the musculoskeletal system and connective tissue: Secondary | ICD-10-CM

## 2021-04-12 DIAGNOSIS — J338 Other polyp of sinus: Secondary | ICD-10-CM | POA: Insufficient documentation

## 2021-04-12 DIAGNOSIS — M5441 Lumbago with sciatica, right side: Secondary | ICD-10-CM

## 2021-04-12 DIAGNOSIS — M898X6 Other specified disorders of bone, lower leg: Secondary | ICD-10-CM | POA: Insufficient documentation

## 2021-04-12 DIAGNOSIS — K219 Gastro-esophageal reflux disease without esophagitis: Secondary | ICD-10-CM | POA: Insufficient documentation

## 2021-04-12 DIAGNOSIS — F603 Borderline personality disorder: Secondary | ICD-10-CM | POA: Insufficient documentation

## 2021-04-12 DIAGNOSIS — M2170 Unequal limb length (acquired), unspecified site: Secondary | ICD-10-CM | POA: Insufficient documentation

## 2021-04-12 DIAGNOSIS — M5030 Other cervical disc degeneration, unspecified cervical region: Secondary | ICD-10-CM | POA: Insufficient documentation

## 2021-04-12 DIAGNOSIS — S43429A Sprain of unspecified rotator cuff capsule, initial encounter: Secondary | ICD-10-CM | POA: Insufficient documentation

## 2021-04-12 DIAGNOSIS — M5136 Other intervertebral disc degeneration, lumbar region: Secondary | ICD-10-CM | POA: Insufficient documentation

## 2021-04-12 DIAGNOSIS — M7750 Other enthesopathy of unspecified foot: Secondary | ICD-10-CM | POA: Insufficient documentation

## 2021-04-12 DIAGNOSIS — M25551 Pain in right hip: Secondary | ICD-10-CM | POA: Insufficient documentation

## 2021-04-12 NOTE — Telephone Encounter (Signed)
Patient returned to office following today's visit with a copy of office note from [facility], provider Dr Sharolyn Douglas. Copy placed in Dr Sanjuan Dame box for review; original sent to scanning center.

## 2021-04-12 NOTE — Progress Notes (Signed)
Subjective:    Patient ID: Levi Kim, male    DOB: [birth year], 65 y.o.   MRN: [id number]  HPI The patient has been followed in the [place] system. I have copies of note from 03-08-21.  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 I asked that he narrow it down to one or two problems that bother him the most.  From the notes by the [place] he was to have evaluation of the left knee. I had reviewed his notes prior to this visit.  He said his neck and lower back were the most areas of pain.  Therefore, I have told him I would evaluate those body parts today.  I reviewed the MRIs he had done in [place] last year of the cervical spine and thoracic spine.  I have access to those reports via our computer.  I told him he has degenerative changes in the cervical spine but no nerve or nerve root irritated.  His MRI of the thoracic spine was negative but there was an addendum on that but the final report was negative.  I told him that he may need to see rheumatologist.   He cannot take any NSAIDs and cannot use the rubs as they cause him to have hives and shortness of breath.  He has pain over the left elbow near the epicondyle. I have explained what tennis elbow is and have recommended ice massage.  As far as his lumbar spine, he has pain on the right side.  He had surgery in 1998 or 1999 on this.  He has pain that goes to the right buttock area and some to the right hip.  He has had pain in  the right hip prior and post surgery.  He has no weakness.  He gets marked pain at times in the lower back and nothing seems to help it.  He has no new trauma.     Review of Systems  Constitutional:  Positive for activity change.  Respiratory:  Positive for shortness of breath.   Musculoskeletal:  Positive for arthralgias, back pain, gait problem, joint swelling, myalgias and neck pain.  All other systems reviewed and are negative. For Review of Systems, all other systems reviewed and are negative.  The following is a summary of the past history medically, past history surgically, known current medicines, social history and family history.  This information is gathered electronically by the computer from prior information and documentation.  I review this each visit and have found including this information at this point in the chart is beneficial and informative.   Past Medical History:  Diagnosis Date   Alpha galactosidase deficiency    Arthritis    Arthritis    Asthma    High cholesterol     Past Surgical History:  Procedure Laterality Date   APPENDECTOMY     BACK SURGERY     Fistula Repair     HERNIA REPAIR  JOINT REPLACEMENT  2017   SHOULDER SURGERY     SINUS EXPLORATION     testical removal     TONSILLECTOMY      Current Outpatient Medications on File Prior to Visit  Medication Sig Dispense Refill   acetaminophen (TYLENOL) 500 MG tablet Take 1,000-1,500 mg by mouth 3 (three) times daily as needed (pain/headaches.).     albuterol (VENTOLIN HFA) 108 (90 Base) MCG/ACT inhaler Inhale 2 puffs into the lungs every 6 (six) hours as needed for wheezing or shortness of breath.     alfuzosin (UROXATRAL) 10 MG 24 hr tablet Take 10 mg by mouth at bedtime.     Cannabidiol POWD Apply 1 application topically 3 (three) times daily as needed (pain). CBD Oil     Cetirizine HCl (ZYRTEC ALLERGY) 10 MG CAPS Take 10 mg by mouth at bedtime. gelcap     cyclobenzaprine (FLEXERIL) 10 MG  tablet Take 1 tablet (10 mg total) by mouth at bedtime. 30 tablet 0   ketotifen (ZADITOR) 0.025 % ophthalmic solution Place 1 drop into both eyes 2 (two) times daily as needed (allergy eyes).     mometasone (ELOCON) 0.1 % lotion Apply 1 application topically daily as needed (ear fungus/itching in ears).     traZODone (DESYREL) 50 MG tablet Take 25 mg by mouth at bedtime.     No current facility-administered medications on file prior to visit.    Social History   Socioeconomic History   Marital status: Married    Spouse name: Not on file   Number of children: Not on file   Years of education: Not on file   Highest education level: Not on file  Occupational History   Not on file  Tobacco Use   Smoking status: Former   Smokeless tobacco: Never  Vaping Use   Vaping Use: Never used  Substance and Sexual Activity   Alcohol use: No   Drug use: No   Sexual activity: Not on file  Other Topics Concern   Not on file  Social History Narrative   Not on file   Social Determinants of Health   Financial Resource Strain: Not on file  Food Insecurity: Not on file  Transportation Needs: Not on file  Physical Activity: Not on file  Stress: Not on file  Social Connections: Not on file  Intimate Partner Violence: Not on file    Family History  Problem Relation Age of Onset   Colon cancer Neg Hx     BP (!) 142/83   Pulse 75   Ht 5\' 10"  (1.778 m)   Wt 176 lb (79.8 kg)   BMI 25.25 kg/m   Body mass index is 25.25 kg/m.     Objective:   Physical Exam Vitals and nursing note reviewed. Exam conducted with a chaperone present.  Constitutional:      Appearance: He is well-developed.  HENT:     Head: Normocephalic and atraumatic.  Eyes:     Conjunctiva/sclera: Conjunctivae normal.     Pupils: Pupils are equal, round, and reactive to light.  Cardiovascular:     Rate and Rhythm: Normal rate and regular rhythm.  Pulmonary:     Effort: Pulmonary effort is normal.  Abdominal:      Palpations: Abdomen is soft.  Musculoskeletal:       Arms:     Cervical back: Normal range of motion and neck supple.  Skin:    General: Skin is warm and dry.  Neurological:  Mental Status: He is alert and oriented to person, place, and time.     Cranial Nerves: No cranial nerve deficit.     Motor: No abnormal muscle tone.     Coordination: Coordination normal.     Deep Tendon Reflexes: Reflexes are normal and symmetric. Reflexes normal.  Psychiatric:        Behavior: Behavior normal.        Thought Content: Thought content normal.        Judgment: Judgment normal.  X-rays were done of the lumbar spine, reported separately.        Assessment & Plan:   Encounter Diagnoses  Name Primary?   Chronic midline low back pain with right-sided sciatica Yes   History of left tennis elbow    Arthralgia of multiple joints    I will [facility].  He will need open unit.  He has not had one of the lumbar area in some time.  I recommend he see rheumatology for all the multiple joint problems and his inability to take any medicine except tylenol for this.  He cannot take NSAIDs or narcotics.  He cannot use rubs.  He is well versed on medical terminology and I told him to review his results.  He will get the MRI done in [facility] by Dr. Noel Gerold several years ago.  Do the ice massage for the elbow on the left.  Return in three weeks.  Call if any problem.  Precautions discussed.  Electronically Signed Darreld Mclean, MD 9/13/202210:52 AM

## 2021-05-02 ENCOUNTER — Telehealth: Payer: Self-pay | Admitting: Orthopaedic Surgery

## 2021-05-02 NOTE — Telephone Encounter (Signed)
Spoke with patient and was told the same as noted. I did ask if he would like to try [facility] if it is ok'd by the [place]. Stated he would but this time might need to go ahead and get something to help with the nervousness. I told him that I would get with my manager and see what we can do to get his test taken care of. I did ask if he would give [place] a few days to work on this and he was in agreement with this. Please advise what we can do to help this patient.

## 2021-05-02 NOTE — Telephone Encounter (Signed)
Done

## 2021-05-02 NOTE — Telephone Encounter (Signed)
Patient relays he was unable to complete his MRI at [facility] due to having his sinuses draining very badly. States the technician did not respond to his squeezing the button for assistance, so he said he pulled himself out of the tube. Please advise.

## 2021-05-11 NOTE — Telephone Encounter (Signed)
Patient relayed he is rescheduled for tomorrow, 05/12/21 at [facility]; thinks he will be okay.

## 2021-05-12 ENCOUNTER — Telehealth: Payer: Self-pay | Admitting: Orthopaedic Surgery

## 2021-05-12 NOTE — Telephone Encounter (Signed)
Patient called back and left voicemail at 3:23 PM stating he has spoken with the [place] and they are sending him a prescription so he can have the MRI done.   Please call him back.

## 2021-05-12 NOTE — Telephone Encounter (Signed)
Patient called and states he had another failed MRI done.  If you want him to have a MRI done then you're going to have to sedate him and knock him out.  He can't stand to do a open MRI either now.    Call him at the number on his chart

## 2021-05-16 ENCOUNTER — Telehealth: Payer: Self-pay | Admitting: Radiology

## 2021-05-16 ENCOUNTER — Telehealth: Payer: Self-pay

## 2021-05-16 NOTE — Telephone Encounter (Signed)
Patient could not have MRI done at the open unit either. States he needs to be sedated to have the MRI and he has contacted the [place] about this and they will be sending him a prescription.  Patient left message on voicemail about Dr. Hilda Lias referring him to a Rheumatologist and about his sedative for the MRI. I called patient back and explained to him that we did not refer him to a Rheumatologist but did Dr. Hilda Lias did mention in his note that he should see one for the other issues he is having. I told him that when he receives his prescription from the [place] to give [place] a call and see what the next step is for his treatment.

## 2021-05-16 NOTE — Telephone Encounter (Signed)
Patient called, left a VM stating that he needed updated info on the Rheumatology referral that Dr Hilda Lias made, as far as [facility] goes.  He also asks if Dr Hilda Lias is still going to reach out tmrw about the sedation for the MRI.  Says that that is the only way he will be able to do the MRI.  He asks for a call.

## 2021-05-19 ENCOUNTER — Ambulatory Visit: Payer: No Typology Code available for payment source | Admitting: Orthopaedic Surgery

## 2021-05-19 ENCOUNTER — Telehealth: Payer: Self-pay | Admitting: Radiology

## 2021-05-19 NOTE — Telephone Encounter (Signed)
Patient called LM on voice mail stating he still has not received the Rx from [place] for MRI.  He has an appt with a Rheumatologist per the [place] on 06/06/21.  Today's appt for him has already been cancelled. FYI

## 2021-05-23 ENCOUNTER — Telehealth: Payer: Self-pay

## 2021-05-23 NOTE — Telephone Encounter (Signed)
Patient called to let us know that he has the medication and would like to get the MRI rescheduled. He is having some pain and swelling. He wants to know what is his next step. Do we reschedule MRI?

## 2021-06-16 ENCOUNTER — Ambulatory Visit: Payer: No Typology Code available for payment source | Admitting: Orthopaedic Surgery

## 2021-08-09 ENCOUNTER — Encounter: Payer: Self-pay | Admitting: Orthopaedic Surgery

## 2021-08-09 ENCOUNTER — Ambulatory Visit (INDEPENDENT_AMBULATORY_CARE_PROVIDER_SITE_OTHER): Payer: No Typology Code available for payment source | Admitting: Orthopaedic Surgery

## 2021-08-09 ENCOUNTER — Other Ambulatory Visit: Payer: Self-pay

## 2021-08-09 DIAGNOSIS — M5441 Lumbago with sciatica, right side: Secondary | ICD-10-CM | POA: Diagnosis not present

## 2021-08-09 DIAGNOSIS — G8929 Other chronic pain: Secondary | ICD-10-CM

## 2021-08-09 NOTE — Progress Notes (Signed)
I got that MRI.  He had MRI of the lumbar spine which showed: Postoperative changes from anterior arthrodesis at L5-S1 with solid fusion and mild degenerative changes.  No spinal stenosis was found.  I have independently reviewed the MRI.  I returned the discs to him  I have explained to him the findings.  He had read the report prior.  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  He has a MRI of the right hip pending he says.  That doctor will take care of that problem.  His lower back is diffusely tender but no spasm, muscle tone and strength is normal.  Encounter Diagnosis  Name Primary?   Chronic midline low back pain with right-sided sciatica Yes   I will see him as needed.  Call if any problem.  Precautions discussed.  Electronically Signed Darreld Mclean, MD 1/10/202310:45 AM

## 2021-08-09 NOTE — Patient Instructions (Signed)
Call your primary care office regarding the MRI scan of the hip if you cannot get it done there, you can get [place] a disk of the x rays and we can order the MRI if you need. Dr Hilda Lias will have to see the x rays before he can order the MRI.

## 2021-08-17 ENCOUNTER — Other Ambulatory Visit: Payer: Self-pay | Admitting: Internal Medicine

## 2021-08-17 ENCOUNTER — Other Ambulatory Visit (HOSPITAL_COMMUNITY): Payer: Self-pay | Admitting: Internal Medicine

## 2021-08-17 DIAGNOSIS — R110 Nausea: Secondary | ICD-10-CM

## 2021-08-17 DIAGNOSIS — J329 Chronic sinusitis, unspecified: Secondary | ICD-10-CM

## 2021-08-25 ENCOUNTER — Other Ambulatory Visit: Payer: Self-pay | Admitting: Internal Medicine

## 2021-08-25 ENCOUNTER — Other Ambulatory Visit (HOSPITAL_COMMUNITY): Payer: Self-pay | Admitting: Internal Medicine

## 2021-08-25 DIAGNOSIS — J329 Chronic sinusitis, unspecified: Secondary | ICD-10-CM

## 2021-09-08 ENCOUNTER — Other Ambulatory Visit: Payer: Self-pay

## 2021-09-08 ENCOUNTER — Ambulatory Visit (HOSPITAL_COMMUNITY)
Admission: RE | Admit: 2021-09-08 | Discharge: 2021-09-08 | Disposition: A | Discharge status: Home / Self Care | Payer: No Typology Code available for payment source | Source: Ambulatory Visit | Attending: Internal Medicine | Admitting: Internal Medicine

## 2021-09-08 DIAGNOSIS — R110 Nausea: Secondary | ICD-10-CM

## 2021-09-08 DIAGNOSIS — J329 Chronic sinusitis, unspecified: Secondary | ICD-10-CM | POA: Insufficient documentation

## 2021-09-17 IMAGING — DX DG HIP (WITH OR WITHOUT PELVIS) 2-3V*R*
3 series · 3 of 3 positions shown · non-contrast
Comparison: 04/09/2017

CLINICAL DATA: RIGHT hip and low back pain

EXAM:
DG HIP (WITH OR WITHOUT PELVIS) 2-3V RIGHT

[hip ap]
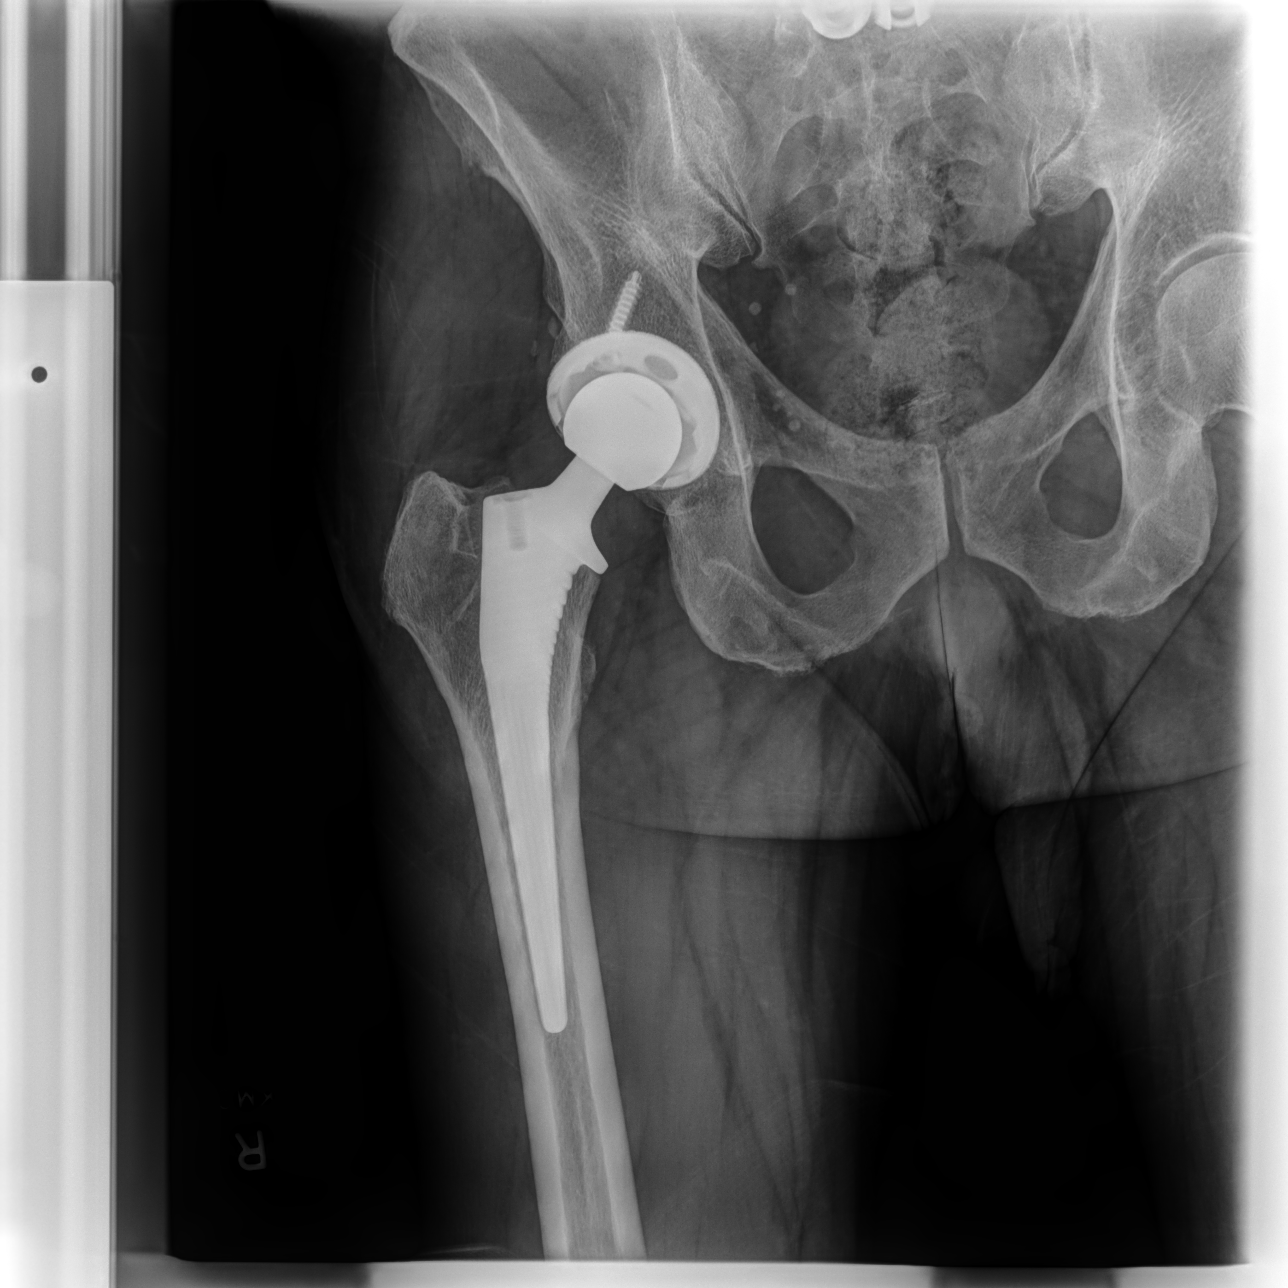

[hip (frog leg)]
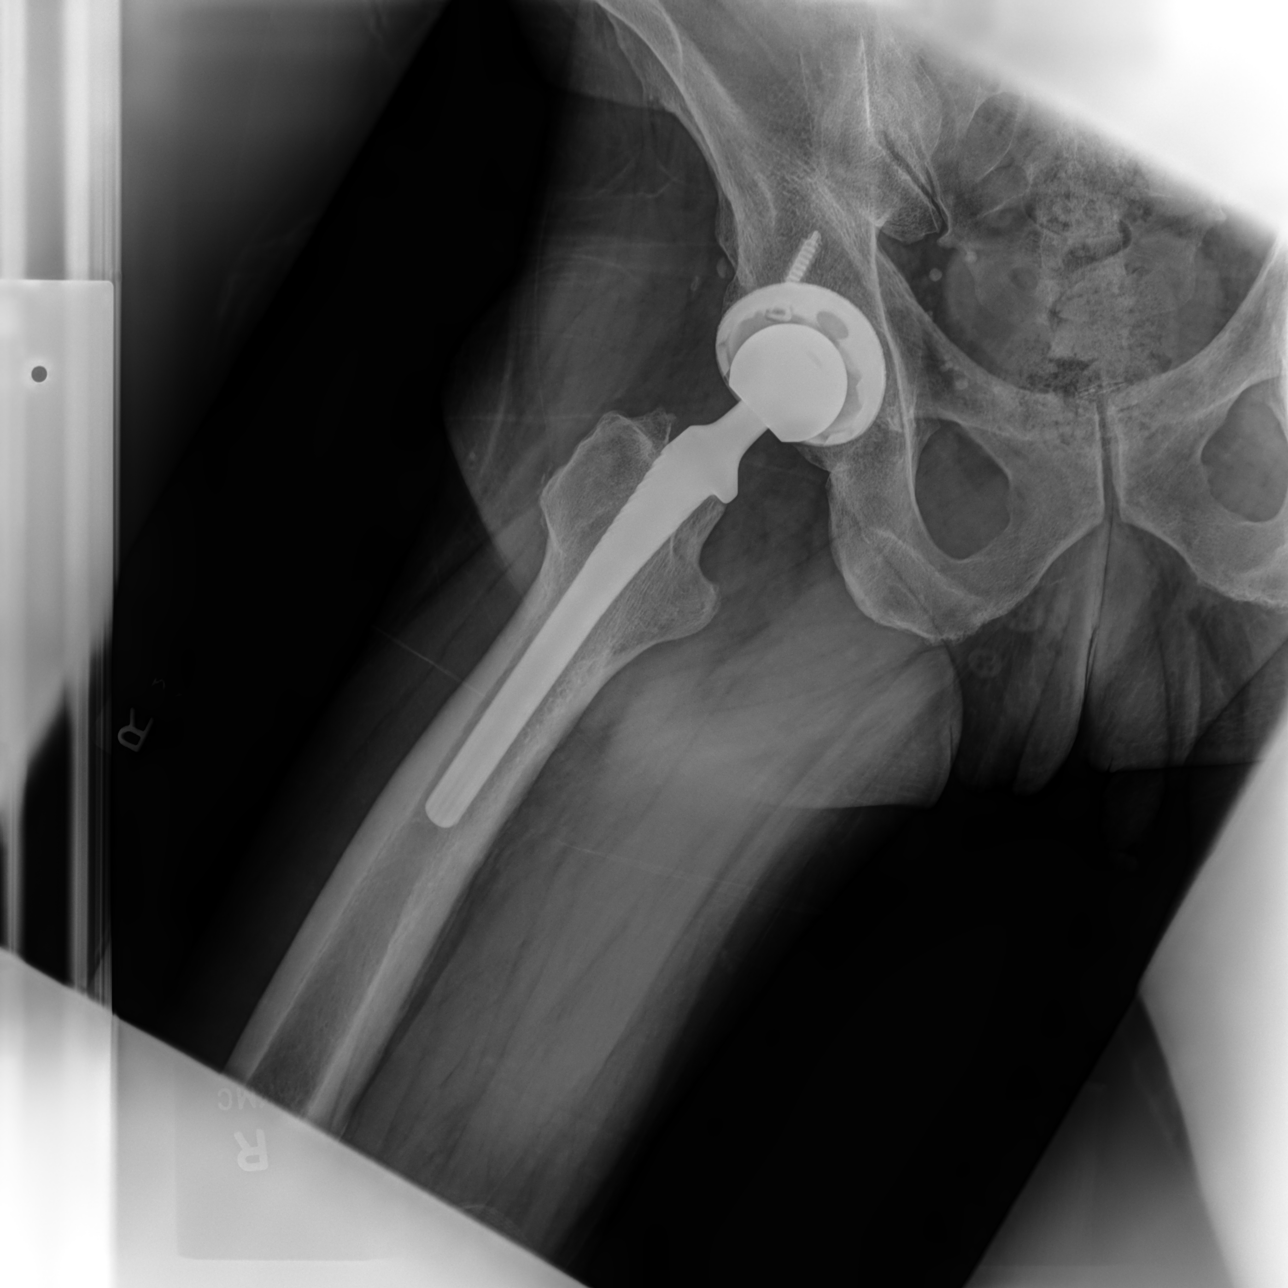

[pelvis ap]
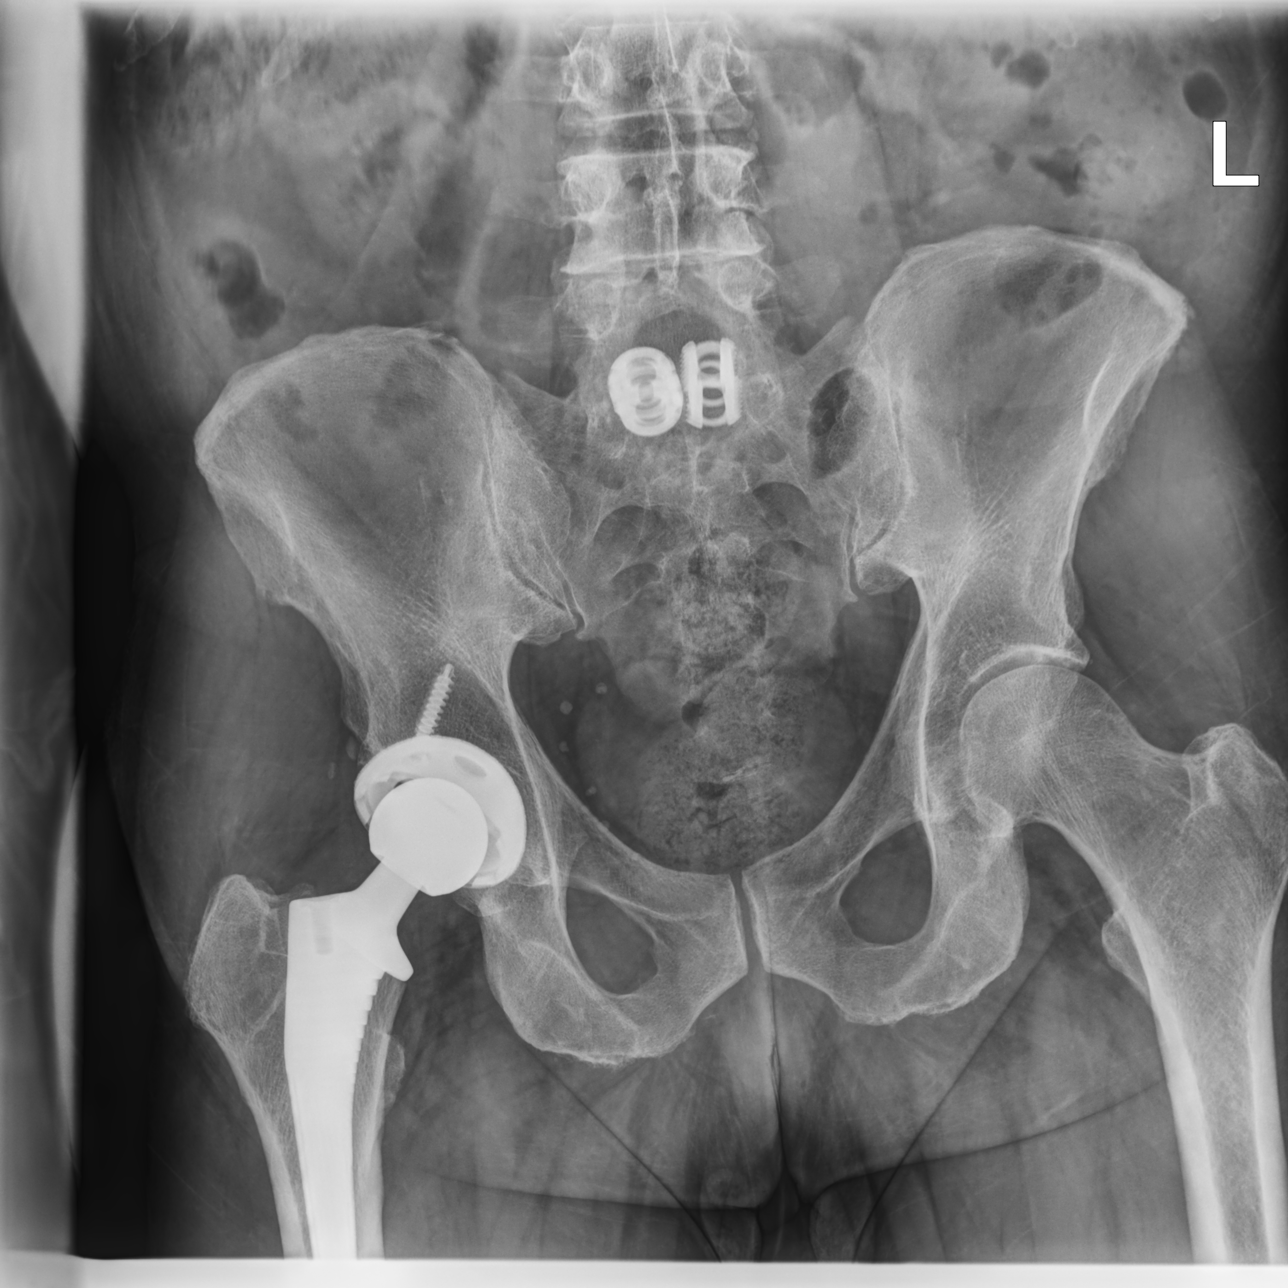

[3 of 3 positions shown; findings below may reference images not displayed]

FINDINGS: Osseous mineralization low normal.

Components of a RIGHT hip prosthesis are again identified,
unchanged.

No acute fracture, dislocation, or bone destruction.

No periprosthetic lucency.

Prior L5-S1 fusion.

Few pelvic phleboliths.

Pelvis intact with preserved SI joints and LEFT hip joint.
IMPRESSION: No acute abnormalities.

## 2022-01-12 ENCOUNTER — Ambulatory Visit
Admission: EM | Admit: 2022-01-12 | Discharge: 2022-01-12 | Disposition: A | Discharge status: Home / Self Care | Payer: No Typology Code available for payment source

## 2022-01-12 ENCOUNTER — Other Ambulatory Visit: Payer: Self-pay

## 2022-01-12 DIAGNOSIS — R5383 Other fatigue: Secondary | ICD-10-CM

## 2022-01-12 LAB — POCT URINALYSIS DIP (MANUAL ENTRY)
Bilirubin, UA: NEGATIVE
Glucose, UA: NEGATIVE mg/dL
Ketones, POC UA: NEGATIVE mg/dL
Leukocytes, UA: NEGATIVE
Nitrite, UA: NEGATIVE
Protein Ur, POC: NEGATIVE mg/dL
Spec Grav, UA: 1.01 (ref 1.010–1.025)
Urobilinogen, UA: 0.2 E.U./dL
pH, UA: 6 (ref 5.0–8.0)

## 2022-01-12 NOTE — ED Provider Notes (Signed)
[facility]    CSN: [id number] Arrival date & time: 01/12/22  1224      History   Chief Complaint Chief Complaint  Patient presents with   Fatigue   Generalized Body Aches         HPI Levi Kim is a 66 y.o. male.   The history is provided by the patient.   Patient presents for complaints of fatigue, body aches, tingling" all over his body for the past month.  Patient states symptoms started after he had his colonoscopy.  He denies fever, chills, chest pain, shortness of breath, difficulty breathing, joint pain or swelling.  He also complains of generalized joint pain.  Patient does have a primary care physician and is also seen at the [place].  He states that he was told to come to urgent care for his symptoms.  Has not taken any medication for his symptoms.  Reports a history of arthritis, asthma, and hyperlipidemia.  Patient informs his colonoscopy resulted a benign adenoma.  Patient states that he is mostly active.  He states that he eats well, and does not drink alcohol or smoke.  Past Medical History:  Diagnosis Date   Alpha galactosidase deficiency    Arthritis    Arthritis    Asthma    High cholesterol     Patient Active Problem List   Diagnosis Date Noted   Borderline personality disorder (HCC) 04/12/2021   DDD (degenerative disc disease), cervical 04/12/2021   Degeneration of lumbar intervertebral disc 04/12/2021   Enthesopathy of ankle and tarsus 04/12/2021   Gastroesophageal reflux disease 04/12/2021   History of total hip arthroplasty 04/12/2021   Leg length inequality 04/12/2021   Other specified disorders of bone, lower leg 04/12/2021   Pain in finger of left hand 04/12/2021   Pain in joint, hand 04/12/2021   Pain in right hip 04/12/2021   Polyp of nasal sinus 04/12/2021   Sprain of rotator cuff capsule 04/12/2021   Change in stool 08/25/2020   History of colonic polyps 08/25/2020   Presbycusis of both ears 01/19/2020   Generalized  abdominal pain 07/04/2017    Past Surgical History:  Procedure Laterality Date   APPENDECTOMY     BACK SURGERY     Fistula Repair     HERNIA REPAIR     JOINT REPLACEMENT  2017   SHOULDER SURGERY     SINUS EXPLORATION     testical removal     TONSILLECTOMY         Home Medications    Prior to Admission medications   Medication Sig Start Date End Date Taking? Authorizing Provider  acetaminophen (TYLENOL) 500 MG tablet Take 1,000-1,500 mg by mouth 3 (three) times daily as needed (pain/headaches.).    [provider]  albuterol (VENTOLIN HFA) 108 (90 Base) MCG/ACT inhaler Inhale 2 puffs into the lungs every 6 (six) hours as needed for wheezing or shortness of breath.    [provider]  alfuzosin (UROXATRAL) 10 MG 24 hr tablet Take 10 mg by mouth at bedtime. 12/21/15   [provider]  Cannabidiol POWD Apply 1 application topically 3 (three) times daily as needed (pain). CBD Oil    [provider]  Cetirizine HCl (ZYRTEC ALLERGY) 10 MG CAPS Take 10 mg by mouth at bedtime. gelcap    [provider]  cyclobenzaprine (FLEXERIL) 10 MG tablet Take 1 tablet (10 mg total) by mouth at bedtime. 09/26/20   Avegno, Zachery Dakins, FNP  ketotifen (  ZADITOR) 0.025 % ophthalmic solution Place 1 drop into both eyes 2 (two) times daily as needed (allergy eyes).    [provider]  levocetirizine (XYZAL) 5 MG tablet Take by mouth.    [provider]  mometasone (ELOCON) 0.1 % lotion Apply 1 application topically daily as needed (ear fungus/itching in ears).    [provider]  traZODone (DESYREL) 50 MG tablet Take 25 mg by mouth at bedtime.    [provider]    Family History Family History  Problem Relation Age of Onset   Colon cancer Neg Hx     Social History Social History   Tobacco Use   Smoking status: Former   Smokeless tobacco: Never  Building services engineer Use: Never used  Substance Use Topics   Alcohol use:  No   Drug use: No     Allergies   Aspirin, Augmentin [amoxicillin-pot clavulanate], Ciprofloxacin, Morphine and related, Opium, Prednisone, Sulfa antibiotics, Alpha-gal, Nsaids, Simvastatin, and Sulfamethoxazole-trimethoprim   Review of Systems Review of Systems PER HPI  Physical Exam Triage Vital Signs ED Triage Vitals [01/12/22 1241]  Enc Vitals Group     BP 134/85     Pulse Rate 82     Resp 18     Temp 99.2 F (37.3 C)     Temp Source Oral     SpO2 93 %     Weight      Height      Head Circumference      Peak Flow      Pain Score      Pain Loc      Pain Edu?      Excl. in GC?    No data found.  Updated Vital Signs BP 134/85 (BP Location: Right Arm)   Pulse 82   Temp 99.2 F (37.3 C) (Oral)   Resp 18   SpO2 93%   Visual Acuity Right Eye Distance:   Left Eye Distance:   Bilateral Distance:    Right Eye Near:   Left Eye Near:    Bilateral Near:     Physical Exam Vitals and nursing note reviewed.  Constitutional:      General: He is not in acute distress.    Appearance: He is well-developed.  HENT:     Head: Normocephalic and atraumatic.     Right Ear: Tympanic membrane, ear canal and external ear normal.     Left Ear: Tympanic membrane, ear canal and external ear normal.     Nose: Nose normal.     Mouth/Throat:     Mouth: Mucous membranes are moist.  Eyes:     Conjunctiva/sclera: Conjunctivae normal.  Cardiovascular:     Rate and Rhythm: Normal rate and regular rhythm.     Pulses: Normal pulses.     Heart sounds: Normal heart sounds. No murmur heard. Pulmonary:     Effort: Pulmonary effort is normal. No respiratory distress.     Breath sounds: Normal breath sounds.  Abdominal:     General: Bowel sounds are normal.     Palpations: Abdomen is soft.     Tenderness: There is no abdominal tenderness.  Musculoskeletal:        General: No swelling.     Cervical back: Normal range of motion and neck supple.  Skin:    General: Skin is warm and  dry.     Capillary Refill: Capillary refill takes less than 2 seconds.  Neurological:  General: No focal deficit present.     Mental Status: He is alert and oriented to person, place, and time.  Psychiatric:        Mood and Affect: Mood normal.        Behavior: Behavior normal.        Thought Content: Thought content normal.        Judgment: Judgment normal.      UC Treatments / Results  Labs (all labs ordered are listed, but only abnormal results are displayed) Labs Reviewed  POCT URINALYSIS DIP (MANUAL ENTRY) - Abnormal; Notable for the following components:      Result Value   Blood, UA trace-lysed (*)    All other components within normal limits  CBC WITH DIFFERENTIAL/PLATELET  BASIC METABOLIC PANEL    EKG: NSR   Radiology No results found.  Procedures Procedures (including critical care time)  Medications Ordered in UC Medications - No data to display  Initial Impression / Assessment and Plan / UC Course  I have reviewed the triage vital signs and the nursing notes.  Pertinent labs & imaging results that were available during my care of the patient were reviewed by me and considered in my medical decision making (see chart for details).  Patient presents for complaints of fatigue, body aches, and "tingling" that has been present since he had his colonoscopy approximately 1 month ago.  His EKG was normal today, he did have trace blood in his urinalysis, but no concern for urinary involvement as he has no urinary symptoms.  CBC and BMP were also collected today.  Patient advised he will be contacted if those results are abnormal.  Discussed with patient that it is difficult to determine the cause of fatigue sometimes.  Because his symptoms have been present for approximately 1 month, it is difficult to ascertain the patient's symptom etiology.  Patient was reassured that his results were normal today, recommend that he follow-up with his primary care or the [place] for  further evaluation. Final Clinical Impressions(s) / UC Diagnoses   Final diagnoses:  Other fatigue     Discharge Instructions      Your urinalysis and EKG were normal. You will be contacted if your other lab results are abnormal. Increase fluids and allow for plenty of rest. Recommend following up with your primary care physician or at the [facility] for further evaluation if your symptoms do not improve.     ED Prescriptions   None    PDMP not reviewed this encounter.   Abran Cantor, NP 01/12/22 1404

## 2022-01-12 NOTE — Discharge Instructions (Addendum)
Your urinalysis and EKG were normal. You will be contacted if your other lab results are abnormal. Increase fluids and allow for plenty of rest. Recommend following up with your primary care physician or at the [facility] for further evaluation if your symptoms do not improve.

## 2022-01-12 NOTE — ED Triage Notes (Signed)
Pt reports fatigue, body aches, tingling :al over the body" after colonoscopy x 1 month. Tylenol gives no relief.

## 2022-01-13 LAB — BASIC METABOLIC PANEL
BUN/Creatinine Ratio: 18 (ref 10–24)
BUN: 12 mg/dL (ref 8–27)
CO2: 21 mmol/L (ref 20–29)
Calcium: 9.3 mg/dL (ref 8.6–10.2)
Chloride: 103 mmol/L (ref 96–106)
Creatinine, Ser: 0.67 mg/dL — ABNORMAL LOW (ref 0.76–1.27)
Glucose: 90 mg/dL (ref 70–99)
Potassium: 4.2 mmol/L (ref 3.5–5.2)
Sodium: 141 mmol/L (ref 134–144)
eGFR: 104 mL/min/{1.73_m2} (ref >59)

## 2022-01-13 LAB — CBC WITH DIFFERENTIAL/PLATELET
Basophils Absolute: 0 10*3/uL (ref 0.0–0.2)
Basos: 1 %
EOS (ABSOLUTE): 0.1 10*3/uL (ref 0.0–0.4)
Eos: 1 %
Hematocrit: 43 % (ref 37.5–51.0)
Hemoglobin: 14.6 g/dL (ref 13.0–17.7)
Immature Grans (Abs): 0 10*3/uL (ref 0.0–0.1)
Immature Granulocytes: 0 %
Lymphocytes Absolute: 1.5 10*3/uL (ref 0.7–3.1)
Lymphs: 20 %
MCH: 30.9 pg (ref 26.6–33.0)
MCHC: 34 g/dL (ref 31.5–35.7)
MCV: 91 fL (ref 79–97)
Monocytes Absolute: 0.6 10*3/uL (ref 0.1–0.9)
Monocytes: 8 %
Neutrophils Absolute: 5.3 10*3/uL (ref 1.4–7.0)
Neutrophils: 70 %
Platelets: 218 10*3/uL (ref 150–450)
RBC: 4.73 x10E6/uL (ref 4.14–5.80)
RDW: 12.9 % (ref 11.6–15.4)
WBC: 7.5 10*3/uL (ref 3.4–10.8)

## 2022-04-28 ENCOUNTER — Telehealth: Payer: Self-pay

## 2022-04-28 NOTE — Telephone Encounter (Signed)
Patient is asking about where he could be referred to, so he could get pool therapy. He is Levi Kim and they are trying to find him a therapy place that has no charge. Arbie Cookey told patient that she thought [facility] had one, but she wasn't sure. Patient states he has arthritis in both hands and wrists, so going to [place] would not really be an option.

## 2022-06-19 ENCOUNTER — Ambulatory Visit
Admission: EM | Admit: 2022-06-19 | Discharge: 2022-06-19 | Disposition: A | Discharge status: Home / Self Care | Payer: No Typology Code available for payment source | Attending: Physician Assistant | Admitting: Physician Assistant

## 2022-06-19 DIAGNOSIS — U071 COVID-19: Secondary | ICD-10-CM | POA: Diagnosis not present

## 2022-06-19 DIAGNOSIS — R051 Acute cough: Secondary | ICD-10-CM | POA: Diagnosis present

## 2022-06-19 LAB — RESP PANEL BY RT-PCR (FLU A&B, COVID) ARPGX2
Influenza A by PCR: NEGATIVE
Influenza B by PCR: NEGATIVE
SARS Coronavirus 2 by RT PCR: POSITIVE — AB
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
## 2022-06-19 NOTE — Discharge Instructions (Signed)
Follow up with your Physicain for recheck  
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
## 2022-08-10 ENCOUNTER — Ambulatory Visit
Admission: EM | Admit: 2022-08-10 | Discharge: 2022-08-10 | Disposition: A | Discharge status: Home / Self Care | Payer: No Typology Code available for payment source | Attending: Nurse Practitioner | Admitting: Nurse Practitioner

## 2022-08-10 ENCOUNTER — Encounter: Payer: Self-pay | Admitting: Emergency Medicine

## 2022-08-10 ENCOUNTER — Other Ambulatory Visit: Payer: Self-pay

## 2022-08-10 DIAGNOSIS — M5412 Radiculopathy, cervical region: Secondary | ICD-10-CM | POA: Diagnosis not present

## 2022-08-10 MED ORDER — TIZANIDINE HCL 4 MG PO TABS: 4.0000 mg | ORAL_TABLET | Freq: Three times a day (TID) | ORAL | 0 refills | Status: DC | PRN

## 2022-08-10 NOTE — ED Triage Notes (Signed)
Pain in neck that goes up to head and down back to shoulder blades that comes through to chest.  States numb in left side of face and pain in left arm.  States left hand is numb.  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

## 2022-08-10 NOTE — ED Provider Notes (Signed)
[facility]    CSN: [id number] Arrival date & time: 08/10/22  1043      History   Chief Complaint No chief complaint on file.   HPI Levi Kim is a 67 y.o. male.   Patient presents today for worsening neck pain for the past 2 days.  No recent injury, trauma, or fall that affected the neck.  Reports there is pain in the left side of the neck that shoots down his left arm, shoots around his head and makes the left side of his face numb.  It is also going down his back to around his left scapula and into his thoracic spine.  He feels this pain shooting through to his "breast bone."  Reports his left hand goes numb with certain positions.  The pain is severe and sharp.  He endorses pain is worse with certain positions.  Has been using Tiger balm, other naturopathic treatments without much benefit.  Reports he has been reaching out to his primary care provider at the [place] without hearing much back.  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  Reports he is very sensitive to medication and does not like to take very much.  He is unable to take aspirin, prednisone, NSAIDs, morphine, or opioids.  He takes a very small amount of methadone occasionally.  He denies any redness, swelling, nausea/vomiting, or fever since the pain began.    Past Medical History:  Diagnosis Date   Alpha galactosidase deficiency    Arthritis    Arthritis    Asthma    High cholesterol     Patient Active Problem List   Diagnosis Date Noted   Borderline personality disorder (HCC) 04/12/2021   DDD (degenerative disc disease), cervical 04/12/2021   Degeneration of lumbar intervertebral disc 04/12/2021   Enthesopathy of ankle and tarsus 04/12/2021   Gastroesophageal reflux disease 04/12/2021   History of total hip arthroplasty 04/12/2021   Leg length inequality 04/12/2021   Other specified disorders of bone, lower leg 04/12/2021   Pain in  finger of left hand 04/12/2021   Pain in joint, hand 04/12/2021   Pain in right hip 04/12/2021   Polyp of nasal sinus 04/12/2021   Sprain of rotator cuff capsule 04/12/2021   Change in stool 08/25/2020   History of colonic polyps 08/25/2020   Presbycusis of both ears 01/19/2020   Generalized abdominal pain 07/04/2017    Past Surgical History:  Procedure Laterality Date   APPENDECTOMY     BACK SURGERY     Fistula Repair     HERNIA REPAIR     JOINT REPLACEMENT  2017   SHOULDER SURGERY     SINUS EXPLORATION     testical removal     TONSILLECTOMY         Home Medications    Prior to Admission medications   Medication Sig Start Date End Date Taking? Authorizing Provider  tiZANidine (ZANAFLEX) 4 MG tablet Take 1 tablet (4 mg total) by mouth every 8 (eight) hours as needed for muscle spasms. Do not take with alcohol or while driving or operating heavy machinery.  May cause drowsiness. 08/10/22  Yes Valentino Nose, NP  acetaminophen (TYLENOL) 500 MG tablet Take 1,000-1,500 mg by mouth 3 (three) times daily as needed (pain/headaches.).    [provider]  albuterol (VENTOLIN HFA) 108 (90 Base) MCG/ACT inhaler Inhale 2 puffs into the lungs every 6 (six) hours as  needed for wheezing or shortness of breath.    [provider]  alfuzosin (UROXATRAL) 10 MG 24 hr tablet Take 10 mg by mouth at bedtime. 12/21/15   [provider]  Cannabidiol POWD Apply 1 application topically 3 (three) times daily as needed (pain). CBD Oil    [provider]  Cetirizine HCl (ZYRTEC ALLERGY) 10 MG CAPS Take 10 mg by mouth at bedtime. gelcap    [provider]  cyclobenzaprine (FLEXERIL) 10 MG tablet Take 1 tablet (10 mg total) by mouth at bedtime. 09/26/20   Avegno, Darrelyn Hillock, FNP  ketotifen (ZADITOR) 0.025 % ophthalmic solution Place 1 drop into both eyes 2 (two) times daily as needed (allergy eyes).    [provider]  levocetirizine (XYZAL) 5 MG tablet  Take by mouth.    [provider]  mometasone (ELOCON) 0.1 % lotion Apply 1 application topically daily as needed (ear fungus/itching in ears).    [provider]  traZODone (DESYREL) 50 MG tablet Take 25 mg by mouth at bedtime.    [provider]    Family History Family History  Problem Relation Age of Onset   Colon cancer Neg Hx     Social History Social History   Tobacco Use   Smoking status: Former   Smokeless tobacco: Never  Scientific laboratory technician Use: Never used  Substance Use Topics   Alcohol use: No   Drug use: No     Allergies   Aspirin, Augmentin [amoxicillin-pot clavulanate], Ciprofloxacin, Morphine and related, Opium, Prednisone, Sulfa antibiotics, Pantoprazole, Alpha-gal, Nsaids, Simvastatin, and Sulfamethoxazole-trimethoprim   Review of Systems Review of Systems Per HPI  Physical Exam Triage Vital Signs ED Triage Vitals  Enc Vitals Group     BP 08/10/22 1047 138/81     Pulse Rate 08/10/22 1047 96     Resp 08/10/22 1047 18     Temp 08/10/22 1047 98.6 F (37 C)     Temp Source 08/10/22 1047 Oral     SpO2 08/10/22 1047 96 %     Weight --      Height --      Head Circumference --      Peak Flow --      Pain Score 08/10/22 1049 10     Pain Loc --      Pain Edu? --      Excl. in [place]? --    No data found.  Updated Vital Signs BP 138/81 (BP Location: Right Arm)   Pulse 96   Temp 98.6 F (37 C) (Oral)   Resp 18   SpO2 96%   Visual Acuity Right Eye Distance:   Left Eye Distance:   Bilateral Distance:    Right Eye Near:   Left Eye Near:    Bilateral Near:     Physical Exam Vitals and nursing note reviewed.  Constitutional:      General: He is not in acute distress.    Appearance: Normal appearance. He is not toxic-appearing.  HENT:     Head: Normocephalic and atraumatic.  Eyes:     General: No scleral icterus.       Right eye: No discharge.        Left eye: No discharge.     Extraocular Movements:  Extraocular movements intact.     Pupils: Pupils are equal, round, and reactive to light.  Neck:   Cardiovascular:     Rate and Rhythm: Normal rate and regular rhythm.  Pulmonary:     Effort: Pulmonary effort is normal. No respiratory distress.  Musculoskeletal:     Cervical back: Normal range of motion. Tenderness present. No rigidity.       Back:     Right lower leg: No edema.     Left lower leg: No edema.     Comments: Patient moving all 4 extremities equally without difficulty.  No obvious swelling, deformity, redness, or bruising.  Patient is tender to palpation in areas marked; no swelling, warmth, obvious deformity appreciated.  Patient has full range of motion to neck, bilateral upper arms, elbows, bilateral lower arms, and bilateral wrists and hands.  Strength is equal bilaterally.  Sensation grossly equal bilaterally.  Lymphadenopathy:     Cervical: No cervical adenopathy.  Skin:    General: Skin is warm and dry.     Capillary Refill: Capillary refill takes less than 2 seconds.     Coloration: Skin is not jaundiced or pale.     Findings: No bruising or erythema.  Neurological:     General: No focal deficit present.     Mental Status: He is alert and oriented to person, place, and time.     Cranial Nerves: Cranial nerves 2-12 are intact. No cranial nerve deficit or facial asymmetry.     Motor: No weakness or tremor.     Coordination: Coordination is intact.     Gait: Gait is intact.  Psychiatric:        Behavior: Behavior is cooperative.      UC Treatments / Results  Labs (all labs ordered are listed, but only abnormal results are displayed) Labs Reviewed - No data to display  EKG   Radiology No results found.  Procedures Procedures (including critical care time)  Medications Ordered in UC Medications - No data to display  Initial Impression / Assessment and Plan / UC Course  I have reviewed the triage vital signs and the nursing notes.  Pertinent labs  & imaging results that were available during my care of the patient were reviewed by me and considered in my medical decision making (see chart for details).   Patient is well-appearing, normotensive, afebrile, not tachycardic, not tachypneic, oxygenating well on room air.    Cervical radicular pain Examination today is without red flags; no red flags in history EKG is also reassuring today I am somewhat concerned about the significant pain Start tizanidine as needed for muscle relaxant; encourage stretches as tolerated With worsening pain or any change in symptoms, strict ER precautions given  The patient was given the opportunity to ask questions.  All questions answered to their satisfaction.  The patient is in agreement to this plan.    Final Clinical Impressions(s) / UC Diagnoses   Final diagnoses:  Cervical radicular pain     Discharge Instructions      The EKG today does not show any significant changes.  I am concerned about your pain.  Please take the tizanidine to help relax the muscles in your back.  If the pain does not improve or if it worsens with the medicine, please go to the ER.    ED Prescriptions     Medication Sig Dispense Auth. Provider   tiZANidine (ZANAFLEX) 4 MG tablet Take 1 tablet (4 mg total) by mouth every 8 (eight) hours as needed for muscle spasms. Do not take with alcohol or while driving or operating heavy machinery.  May cause drowsiness. 30 tablet Eulogio Bear, NP  PDMP not reviewed this encounter.   Valentino Nose, NP 08/10/22 1529

## 2022-08-10 NOTE — Discharge Instructions (Addendum)
The EKG today does not show any significant changes.  I am concerned about your pain.  Please take the tizanidine to help relax the muscles in your back.  If the pain does not improve or if it worsens with the medicine, please go to the ER.

## 2022-08-13 ENCOUNTER — Encounter (HOSPITAL_COMMUNITY): Payer: Self-pay

## 2022-08-13 ENCOUNTER — Emergency Department (HOSPITAL_COMMUNITY): Payer: No Typology Code available for payment source

## 2022-08-13 ENCOUNTER — Emergency Department (HOSPITAL_COMMUNITY)
Admission: EM | Admit: 2022-08-13 | Discharge: 2022-08-13 | Disposition: A | Discharge status: Home / Self Care | Payer: No Typology Code available for payment source | Attending: Emergency Medicine | Admitting: Emergency Medicine

## 2022-08-13 ENCOUNTER — Other Ambulatory Visit: Payer: Self-pay

## 2022-08-13 DIAGNOSIS — M5412 Radiculopathy, cervical region: Secondary | ICD-10-CM | POA: Diagnosis not present

## 2022-08-13 DIAGNOSIS — R079 Chest pain, unspecified: Secondary | ICD-10-CM | POA: Diagnosis present

## 2022-08-13 LAB — BASIC METABOLIC PANEL
Anion gap: 9 (ref 5–15)
BUN: 10 mg/dL (ref 8–23)
CO2: 28 mmol/L (ref 22–32)
Calcium: 8.9 mg/dL (ref 8.9–10.3)
Chloride: 101 mmol/L (ref 98–111)
Creatinine, Ser: 0.75 mg/dL (ref 0.61–1.24)
GFR, Estimated: >60 mL/min (ref >60)
Glucose, Bld: 118 mg/dL — ABNORMAL HIGH (ref 70–99)
Potassium: 3.3 mmol/L — ABNORMAL LOW (ref 3.5–5.1)
Sodium: 138 mmol/L (ref 135–145)

## 2022-08-13 LAB — CBC WITH DIFFERENTIAL/PLATELET
Abs Immature Granulocytes: 0.02 10*3/uL (ref 0.00–0.07)
Basophils Absolute: 0.1 10*3/uL (ref 0.0–0.1)
Basophils Relative: 1 %
Eosinophils Absolute: 0.2 10*3/uL (ref 0.0–0.5)
Eosinophils Relative: 2 %
HCT: 47 % (ref 39.0–52.0)
Hemoglobin: 15.8 g/dL (ref 13.0–17.0)
Immature Granulocytes: 0 %
Lymphocytes Relative: 36 %
Lymphs Abs: 2.9 10*3/uL (ref 0.7–4.0)
MCH: 31 pg (ref 26.0–34.0)
MCHC: 33.6 g/dL (ref 30.0–36.0)
MCV: 92.2 fL (ref 80.0–100.0)
Monocytes Absolute: 0.6 10*3/uL (ref 0.1–1.0)
Monocytes Relative: 7 %
Neutro Abs: 4.3 10*3/uL (ref 1.7–7.7)
Neutrophils Relative %: 54 %
Platelets: 266 10*3/uL (ref 150–400)
RBC: 5.1 MIL/uL (ref 4.22–5.81)
RDW: 12.8 % (ref 11.5–15.5)
WBC: 8 10*3/uL (ref 4.0–10.5)
nRBC: 0 % (ref 0.0–0.2)

## 2022-08-13 LAB — TROPONIN I (HIGH SENSITIVITY)
Troponin I (High Sensitivity): 3 ng/L (ref <18)
Troponin I (High Sensitivity): 3 ng/L (ref <18)

## 2022-08-13 MED ORDER — HYDROCODONE-ACETAMINOPHEN 5-325 MG PO TABS: 1.0000 | ORAL_TABLET | ORAL | 0 refills | Status: AC | PRN

## 2022-08-13 MED ORDER — METHYLPREDNISOLONE 4 MG PO TBPK: ORAL_TABLET | ORAL | 0 refills | Status: DC
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
## 2022-08-13 NOTE — ED Triage Notes (Signed)
Pt arrived to triage complaining of left sided chest, arm and back pain that started 1 wk ago. Was seen at [facility] and given muscle relaxer without relief

## 2022-09-07 IMAGING — CT CT TEMPORAL BONES W/O CM
3 of 8 series · 15 of 40 positions shown, 17 images · non-contrast
Comparison: No pertinent prior exam.

CLINICAL DATA: Left ear pain.  Left sinus pain.

EXAM:
CT TEMPORAL BONES WITHOUT CONTRAST
TECHNIQUE: Axial and coronal plane CT imaging of the petrous temporal bones was
performed with thin-collimation image reconstruction. No intravenous
contrast was administered. Multiplanar CT image reconstructions were
also generated.
RADIATION DOSE REDUCTION: This exam was performed according to the
departmental dose-optimization program which includes automated
exposure control, adjustment of the mA and/or kV according to
patient size and/or use of iterative reconstruction technique.

[Series 2: ax bone · axial · 0.34mm/px · z∈[-2,+65]mm · 7 of 150 slices shown, 9 images]
[im 19/150  brain]
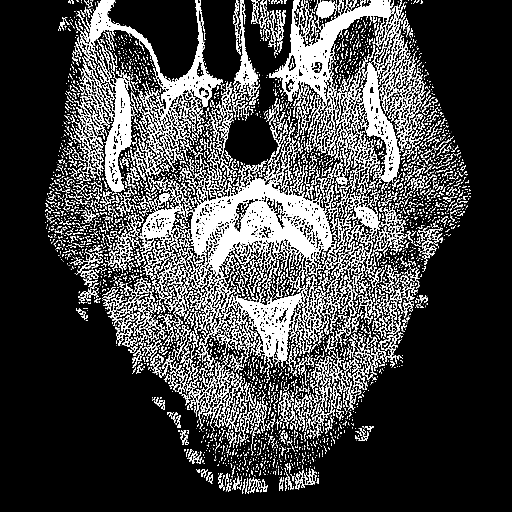
[im 19/150  bone]
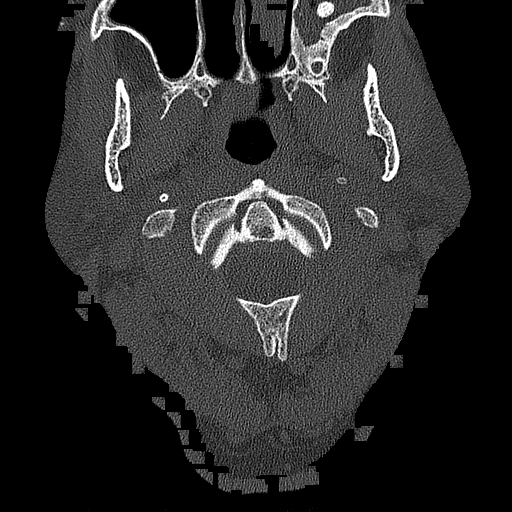
[im 38/150  bone]
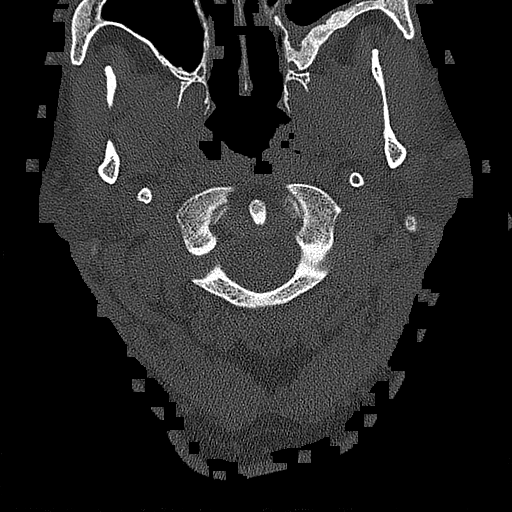
[im 56/150  bone]
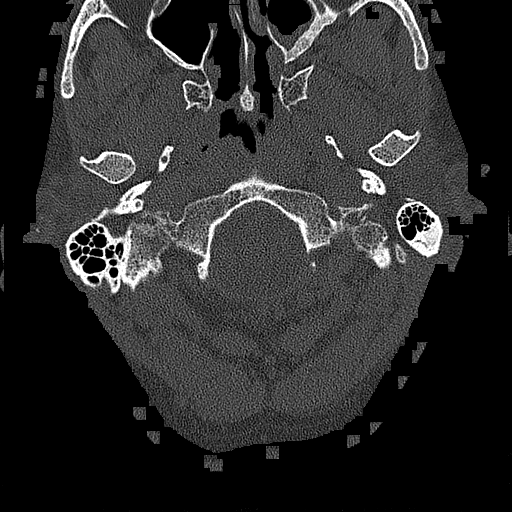
[im 75/150  bone]
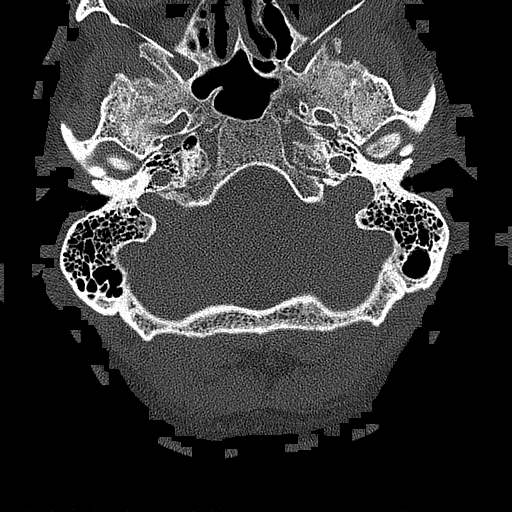
[im 94/150  brain]
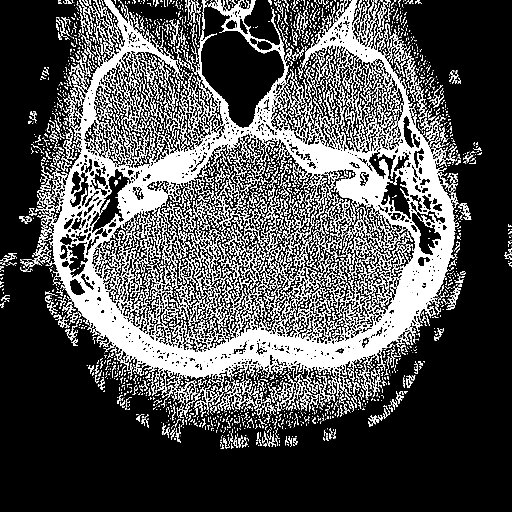
[im 94/150  bone]
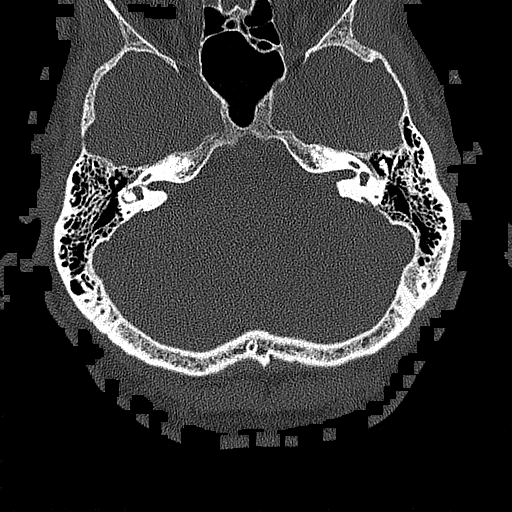
[im 112/150  bone]
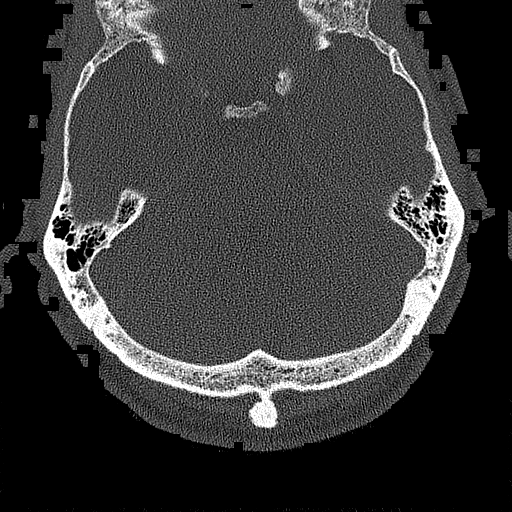
[im 131/150  bone]
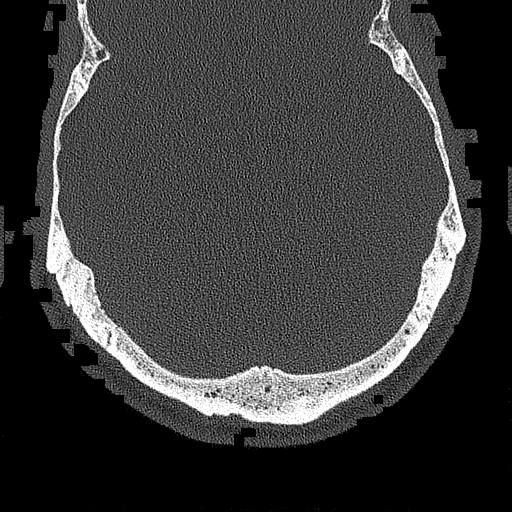

[Series 4: coronal bone. · coronal · 0.22mm/px · 3 of 311 slices shown]
[im 78/311  bone]
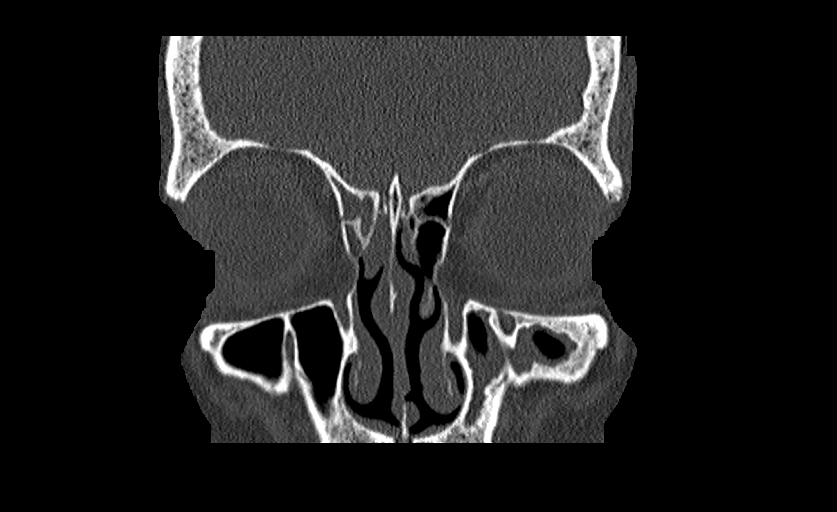
[im 156/311  bone]
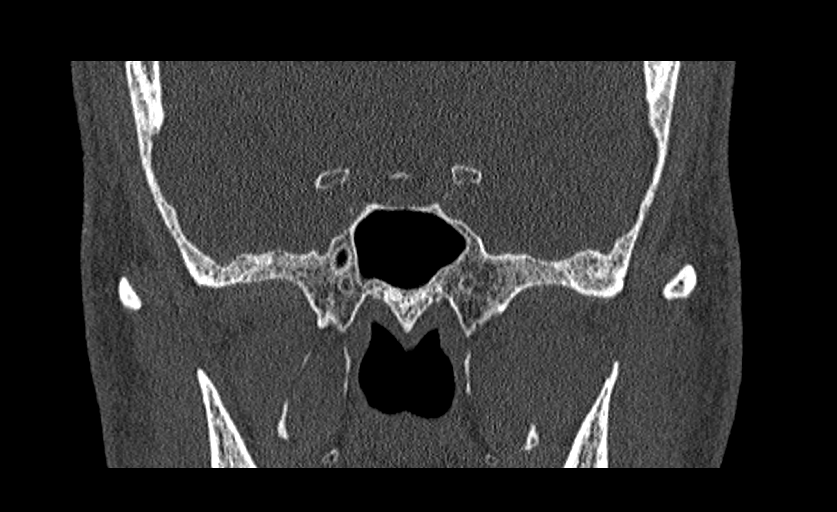
[im 233/311  bone]
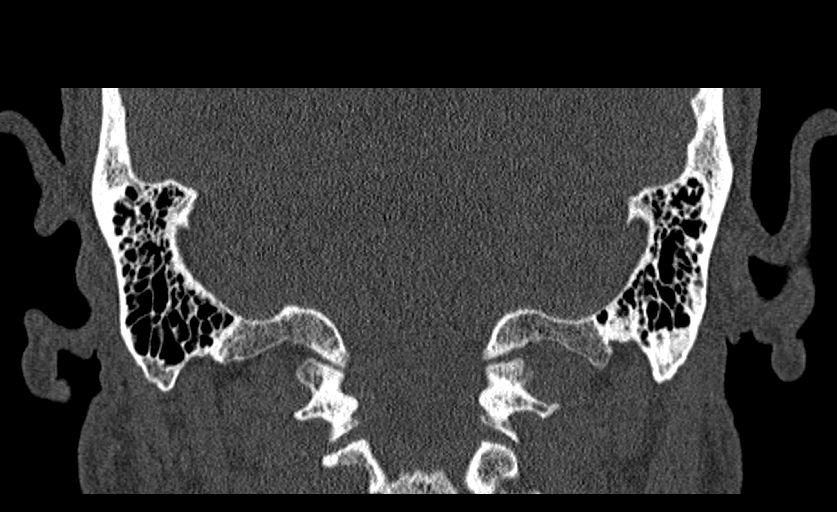

[Series 5: ax mag right · axial · 0.21mm/px · z∈[-3,+41]mm · 5 of 146 slices shown]
[im 19/146  bone]
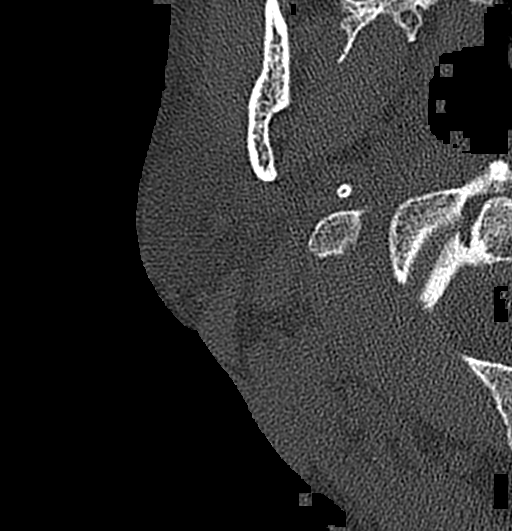
[im 37/146  bone]
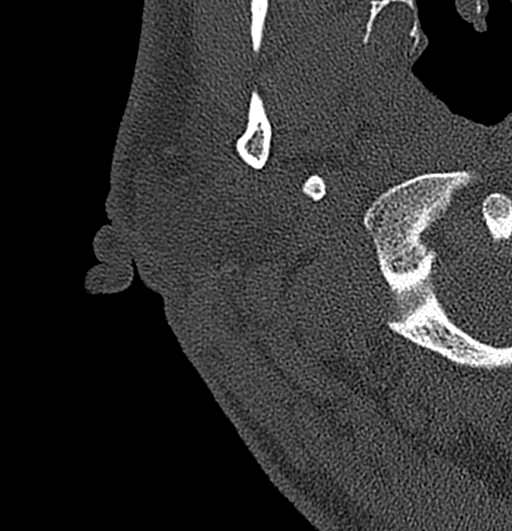
[im 55/146  bone]
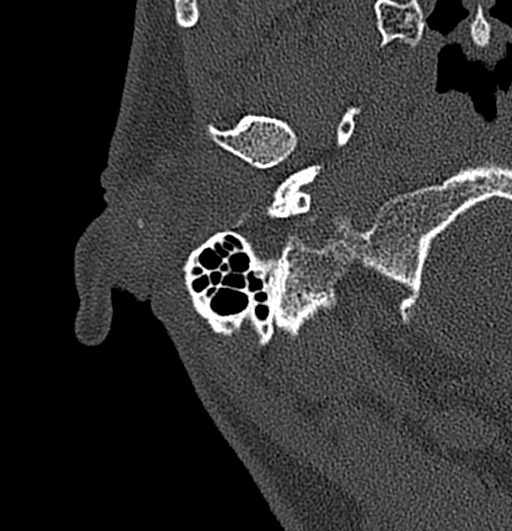
[im 73/146  bone]
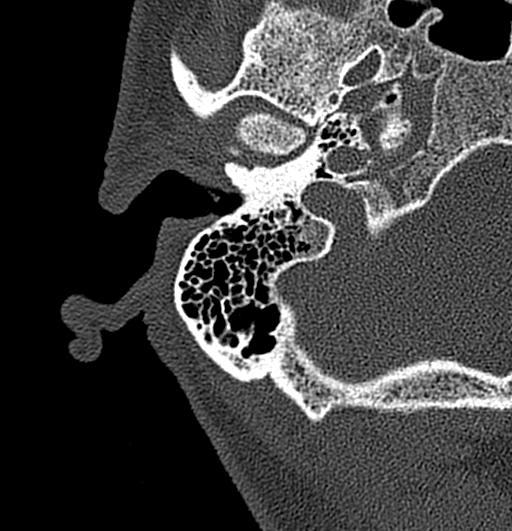
[im 91/146  bone]
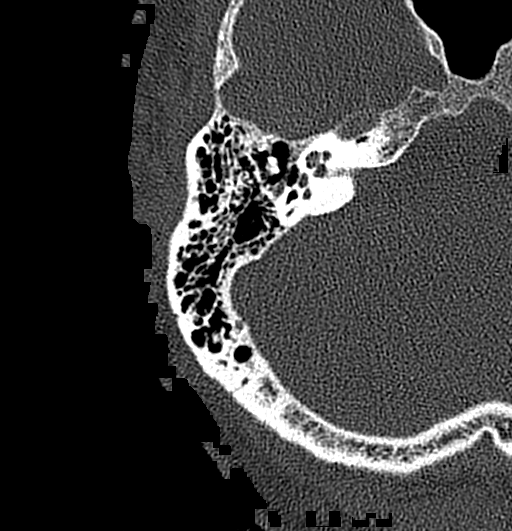

[15 of 40 positions shown; findings below may reference images not displayed]

FINDINGS: Right temporal bone: External auditory canal widely patent. Tympanic
membrane intact. Ossicular chain is normal. No fluid in the middle
ear or attic. Mastoid air cells are clear. Inner ear structures are
normal.

Left temporal bone: External auditory canal widely patent. Tympanic
membrane intact. Ossicular chain is normal. No fluid in the middle
ear or attic. Mastoid air cells are clear. Inner ear structures are
normal. Surrounding soft tissues appear normal. No abnormality seen
to explain left-sided pain.
IMPRESSION: Normal temporal bone CT. No abnormality seen to explain pain on the
left.

## 2022-09-13 ENCOUNTER — Other Ambulatory Visit: Payer: Self-pay | Admitting: Neurological Surgery

## 2022-09-27 NOTE — Progress Notes (Signed)
Surgical Instructions    Your procedure is scheduled on Thursday, October 06, 2022.  Report to [facility] Main Entrance "A" at 7:45 A.M., then check in with the Admitting office.  Call this number if you have problems the morning of surgery:  [phone]   If you have any questions prior to your surgery date call [phone]: Open Monday-Friday 8am-4pm If you experience any cold or flu symptoms such as cough, fever, chills, shortness of breath, etc. between now and your scheduled surgery, please notify us at the above number     Remember:  Do not eat or drink after midnight the night before your surgery    Take these medicines the morning of surgery with A SIP OF WATER:  None  As needed: acetaminophen (TYLENOL)  albuterol (VENTOLIN HFA) Please bring with you to the hospital HYDROcodone-acetaminophen (NORCO/VICODIN  ketotifen (ZADITOR) mometasone (ELOCON) tiZANidine (ZANAFLEX)     As of today, STOP taking any Aspirin (unless otherwise instructed by your surgeon) Aleve, Naproxen, Ibuprofen, Motrin, Advil, Goody's, BC's, all herbal medications, fish oil, and all vitamins.           Do not wear jewelry Do not wear lotions, powders, cologne or deodorant. Do not shave 48 hours prior to surgery.   Do not bring valuables to the hospital.  [facility] is not responsible for any belongings or valuables.    Do NOT Smoke (Tobacco/Vaping)  24 hours prior to your procedure  If you use a CPAP at night, you may bring your mask for your overnight stay.   Contacts, glasses, hearing aids, dentures or partials may not be worn into surgery, please bring cases for these belongings   For patients admitted to the hospital, discharge time will be determined by your treatment team.   Patients discharged the day of surgery will not be allowed to drive home, and someone needs to stay with them for 24 hours.   SURGICAL WAITING ROOM VISITATION Patients having surgery or a procedure may have no  more than 2 support people in the waiting area - these visitors may rotate.   Children under the age of 39 must have an adult with them who is not the patient. If the patient needs to stay at the hospital during part of their recovery, the visitor guidelines for inpatient rooms apply. Pre-op nurse will coordinate an appropriate time for 1 support person to accompany patient in pre-op.  This support person may not rotate.   Please refer to [email or web address] for the visitor guidelines for Inpatients (after your surgery is over and you are in a regular room).    Special instructions:    Oral Hygiene is also important to reduce your risk of infection.  Remember - BRUSH YOUR TEETH THE MORNING OF SURGERY WITH YOUR REGULAR TOOTHPASTE   [facility]- Preparing For Surgery  Before surgery, you can play an important role. Because skin is not sterile, your skin needs to be as free of germs as possible. You can reduce the number of germs on your skin by washing with CHG (chlorahexidine gluconate) Soap before surgery.  CHG is an antiseptic cleaner which kills germs and bonds with the skin to continue killing germs even after washing.     Please do not use if you have an allergy to CHG or antibacterial soaps. If your skin becomes reddened/irritated stop using the CHG.  Do not shave (including legs and underarms) for at least 48 hours prior to first CHG shower. It is OK to  shave your face.  Please follow these instructions carefully.     Shower the NIGHT BEFORE SURGERY and the MORNING OF SURGERY with CHG Soap.   If you chose to wash your hair, wash your hair first as usual with your normal shampoo. After you shampoo, rinse your hair and body thoroughly to remove the shampoo.  Then [employer] and genitals (private parts) with your normal soap and rinse thoroughly to remove soap.  After that Use CHG Soap as you would any other liquid soap. You can apply CHG  directly to the skin and wash gently with a scrungie or a clean washcloth.   Apply the CHG Soap to your body ONLY FROM THE NECK DOWN.  Do not use on open wounds or open sores. Avoid contact with your eyes, ears, mouth and genitals (private parts). Wash Face and genitals (private parts)  with your normal soap.   Wash thoroughly, paying special attention to the area where your surgery will be performed.  Thoroughly rinse your body with warm water from the neck down.  DO NOT shower/wash with your normal soap after using and rinsing off the CHG Soap.  Pat yourself dry with a CLEAN TOWEL.  Wear CLEAN PAJAMAS to bed the night before surgery  Place CLEAN SHEETS on your bed the night before your surgery  DO NOT SLEEP WITH PETS.   Day of Surgery:  Take a shower with CHG soap. Wear Clean/Comfortable clothing the morning of surgery Do not apply any deodorants/lotions.   Remember to brush your teeth WITH YOUR REGULAR TOOTHPASTE.    If you received a COVID test during your pre-op visit, it is requested that you wear a mask when out in public, stay away from anyone that may not be feeling well, and notify your surgeon if you develop symptoms. If you have been in contact with anyone that has tested positive in the last 10 days, please notify your surgeon.    Please read over the following fact sheets that you were given.

## 2022-09-28 ENCOUNTER — Encounter (HOSPITAL_COMMUNITY): Payer: Self-pay | Admitting: Physician Assistant

## 2022-09-28 ENCOUNTER — Inpatient Hospital Stay (HOSPITAL_COMMUNITY)
Admission: RE | Admit: 2022-09-28 | Discharge: 2022-09-28 | Disposition: A | Discharge status: Home / Self Care | Payer: No Typology Code available for payment source | Source: Ambulatory Visit

## 2022-09-28 ENCOUNTER — Encounter: Payer: Self-pay | Admitting: Radiology

## 2022-09-28 NOTE — Progress Notes (Signed)
Patient called at 1130 on 09/28/2022 to report he is not feeling well; Covid like symptoms to include body aches, headache, runny nose.  Instructed patient to call Dr. Ronnald Ramp' office to see about getting a Covid test or postponement of surgery.

## 2022-11-01 ENCOUNTER — Ambulatory Visit (HOSPITAL_COMMUNITY)
Admission: RE | Admit: 2022-11-01 | Payer: No Typology Code available for payment source | Source: Ambulatory Visit | Admitting: Neurological Surgery

## 2022-11-01 SURGERY — ANTERIOR CERVICAL DECOMPRESSION/DISCECTOMY FUSION 2 LEVELS
Anesthesia: General

## 2022-11-03 ENCOUNTER — Ambulatory Visit
Admission: EM | Admit: 2022-11-03 | Discharge: 2022-11-03 | Disposition: A | Discharge status: Home / Self Care | Payer: No Typology Code available for payment source | Attending: Physician Assistant | Admitting: Physician Assistant

## 2022-11-03 ENCOUNTER — Ambulatory Visit (HOSPITAL_COMMUNITY)
Admission: RE | Admit: 2022-11-03 | Discharge: 2022-11-03 | Disposition: A | Discharge status: Home / Self Care | Payer: No Typology Code available for payment source | Source: Ambulatory Visit | Attending: Physician Assistant | Admitting: Physician Assistant

## 2022-11-03 ENCOUNTER — Encounter: Payer: Self-pay | Admitting: Emergency Medicine

## 2022-11-03 DIAGNOSIS — M79661 Pain in right lower leg: Secondary | ICD-10-CM | POA: Diagnosis not present

## 2022-11-03 DIAGNOSIS — M79604 Pain in right leg: Secondary | ICD-10-CM | POA: Diagnosis present

## 2022-11-03 NOTE — Discharge Instructions (Signed)
Please get ultrasound to rule out a blood clot.  I recommend you use heat and massage to help manage his symptoms.  He can continue Tylenol and previously prescribed hydrocodone as needed.  If you develop any sudden increasing pain, swelling of your leg, chest pain, shortness of breath, heart racing you should be seen immediately.

## 2022-11-03 NOTE — ED Provider Notes (Signed)
[facility]    CSN: [id number] Arrival date & time: 11/03/22  [id number]      History   Chief Complaint No chief complaint on file.   HPI Levi Kim is a 67 y.o. male.   Patient presents today with a 1 day history of right lower leg pain.  He denies any known injury increase activity prior to symptom onset.  Does report that he been standing more than normal as he recently lost a friend and with helping with his funeral arrangements but denies any additional changes to his routine.  Denies any recent trauma.  He reports that pain began in his calf and has spread to involve the medial portion of his knee.  He has tried Tylenol as well as previously prescribed hydrocodone with minimal improvement of symptoms.  Reports pain is rated 6 on a 0-10 pain scale, described as aching, no aggravating leaving factors identified.  He has never had a VTE event in the past.  Denies recent COVID-19, hospitalization, immobilization, surgical procedure, exogenous hormone use, active malignancy.  Denies any associated chest pain, shortness of breath, palpitations.    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   Leg length inequality 04/12/2021   Other specified disorders of bone, lower leg 04/12/2021   Pain in finger of left hand 04/12/2021   Pain in joint, hand 04/12/2021   Pain in right hip 04/12/2021   Polyp of nasal sinus 04/12/2021   Sprain of rotator cuff capsule 04/12/2021   Change in stool 08/25/2020   History of colonic polyps 08/25/2020    Presbycusis of both ears 01/19/2020   Generalized abdominal pain 07/04/2017    Past Surgical History:  Procedure Laterality Date   APPENDECTOMY     BACK SURGERY     Fistula Repair     HERNIA REPAIR     JOINT REPLACEMENT  2017   SHOULDER SURGERY     SINUS EXPLORATION     testical removal     TONSILLECTOMY         Home Medications    Prior to Admission medications   Medication Sig Start Date End Date Taking? Authorizing Provider  acetaminophen (TYLENOL) 500 MG tablet Take 1,000-1,500 mg by mouth 3 (three) times daily as needed (pain/headaches.).    [provider]  albuterol (VENTOLIN HFA) 108 (90 Base) MCG/ACT inhaler Inhale 2 puffs into the lungs every 6 (six) hours as needed for wheezing or shortness of breath.    [provider]  alfuzosin (UROXATRAL) 10 MG 24 hr tablet Take 10 mg by mouth at bedtime. 12/21/15   [provider]  cetirizine (ZYRTEC) 10 MG tablet Take 10 mg by mouth at bedtime.    [provider]  HYDROcodone-acetaminophen (NORCO/VICODIN) 5-325 MG tablet Take 1 tablet by mouth every 4 (four) hours as needed for moderate pain. Patient taking differently: Take 0.5 tablets by mouth 2 (two) times daily as needed for moderate pain. 08/13/22   Gilda CreasePollina, Christopher J, MD  ketotifen (ZADITOR) 0.025 % ophthalmic solution Place 1  drop into both eyes 2 (two) times daily as needed (allergy eyes).    [provider]  mometasone (ELOCON) 0.1 % lotion Apply 2-3 drops topically daily as needed (ear fungus/itching in ears).    [provider]  tiZANidine (ZANAFLEX) 4 MG tablet Take 1 tablet (4 mg total) by mouth every 8 (eight) hours as needed for muscle spasms. Do not take with alcohol or while driving or operating heavy machinery.  May cause drowsiness. Patient not taking: Reported on 09/26/2022 08/10/22   Valentino Nose, NP  traZODone (DESYREL) 50 MG tablet Take 25 mg by mouth at bedtime.    [provider]     Family History Family History  Problem Relation Age of Onset   Colon cancer Neg Hx     Social History Social History   Tobacco Use   Smoking status: Former   Smokeless tobacco: Never  Building services engineer Use: Never used  Substance Use Topics   Alcohol use: No   Drug use: No     Allergies   Alpha-gal, Aspirin, Augmentin [amoxicillin-pot clavulanate], Bactrim [sulfamethoxazole-trimethoprim], Ciprofloxacin, Cobalt, Latex, Methadone, Morphine and related, Nickel, Nsaids, Opium, Prednisone, Protonix [pantoprazole], Sulfa antibiotics, Cortisone, Demerol [meperidine hcl], Flunisolide, Kenalog [triamcinolone], Lidocaine, Neomycin-bacitracin zn-polymyx, Neurontin [gabapentin], Nortriptyline, Other, Percocet [oxycodone-acetaminophen], and Zocor [simvastatin]   Review of Systems Review of Systems  Constitutional:  Positive for activity change. Negative for appetite change, fatigue and fever.  Respiratory:  Negative for cough and shortness of breath.   Cardiovascular:  Negative for chest pain, palpitations and leg swelling.  Musculoskeletal:  Positive for myalgias. Negative for arthralgias.     Physical Exam Triage Vital Signs ED Triage Vitals  Enc Vitals Group     BP 11/03/22 0848 132/84     Pulse Rate 11/03/22 0848 78     Resp 11/03/22 0848 18     Temp 11/03/22 0848 (!) 97.3 F (36.3 C)     Temp Source 11/03/22 0848 Temporal     SpO2 11/03/22 0848 95 %     Weight --      Height --      Head Circumference --      Peak Flow --      Pain Score 11/03/22 0850 6     Pain Loc --      Pain Edu? --      Excl. in GC? --    No data found.  Updated Vital Signs BP 132/84 (BP Location: Right Arm)   Pulse 78   Temp (!) 97.3 F (36.3 C) (Temporal)   Resp 18   SpO2 95%   Visual Acuity Right Eye Distance:   Left Eye Distance:   Bilateral Distance:    Right Eye Near:   Left Eye Near:    Bilateral Near:     Physical Exam Vitals reviewed.  Constitutional:       General: He is awake.     Appearance: Normal appearance. He is well-developed. He is not ill-appearing.     Comments: Very pleasant male appears stated age in no acute distress sitting comfortably in exam room  HENT:     Head: Normocephalic and atraumatic.     Mouth/Throat:     Pharynx: No oropharyngeal exudate, posterior oropharyngeal erythema or uvula swelling.  Cardiovascular:     Rate and Rhythm: Normal rate and regular rhythm.     Heart sounds: Normal heart sounds, S1 normal and S2 normal. No murmur heard.    Comments: Calf  measures 37 cm bilaterally.  Positive Homans' sign on right. Pulmonary:     Effort: Pulmonary effort is normal.     Breath sounds: Normal breath sounds. No stridor. No wheezing, rhonchi or rales.     Comments: Clear to auscultation bilaterally Musculoskeletal:     Right knee: No swelling or bony tenderness. Normal range of motion. No tenderness. No LCL laxity, MCL laxity, ACL laxity or PCL laxity.     Instability Tests: Anterior drawer test negative. Posterior drawer test negative.     Right lower leg: Tenderness present. No bony tenderness. No edema.     Left lower leg: No edema.     Comments: Right knee: No deformity.  No tenderness palpation over joint.  No ligamentous laxity on exam.  Right leg: Tenderness to palpation over gastrocnemius without deformity.  Positive Homans' sign.  Neurological:     Mental Status: He is alert.  Psychiatric:        Behavior: Behavior is cooperative.      UC Treatments / Results  Labs (all labs ordered are listed, but only abnormal results are displayed) Labs Reviewed - No data to display  EKG   Radiology No results found.  Procedures Procedures (including critical care time)  Medications Ordered in UC Medications - No data to display  Initial Impression / Assessment and Plan / UC Course  I have reviewed the triage vital signs and the nursing notes.  Pertinent labs & imaging results that were available  during my care of the patient were reviewed by me and considered in my medical decision making (see chart for details).     Patient is well-appearing, afebrile, nontoxic, nontachycardic, with oxygen saturation of 95%.  Given positive vomit and sudden onset of right calf pain will obtain ultrasound to rule out DVT.  This was ordered and patient will obtain this outpatient.  We discussed that he can continue over-the-counter medications such as Tylenol as well as his previously prescribed hydrocodone to manage his pain.  Recommended heat and gentle stretch.  Discussed that if he has any worsening or changing symptoms including shortness of breath, chest pain, increased pain, palpitations he needs to be seen immediately.  Strict return precautions given.  Final Clinical Impressions(s) / UC Diagnoses   Final diagnoses:  Right calf pain     Discharge Instructions      Please get ultrasound to rule out a blood clot.  I recommend you use heat and massage to help manage his symptoms.  He can continue Tylenol and previously prescribed hydrocodone as needed.  If you develop any sudden increasing pain, swelling of your leg, chest pain, shortness of breath, heart racing you should be seen immediately.     ED Prescriptions   None    PDMP not reviewed this encounter.   Jeani HawkingRaspet, Hulbert Branscome K, PA-C 11/03/22 [id number]

## 2022-11-03 NOTE — ED Triage Notes (Signed)
Right lower leg pain that extends from ankle to knee.  States some pain in left lower leg as well.  Pain started last night.  States sharp pain in right knee last night.

## 2023-04-09 ENCOUNTER — Other Ambulatory Visit (HOSPITAL_COMMUNITY): Payer: Self-pay | Admitting: Infectious Diseases

## 2023-04-09 DIAGNOSIS — M25552 Pain in left hip: Secondary | ICD-10-CM

## 2023-04-10 ENCOUNTER — Other Ambulatory Visit (HOSPITAL_COMMUNITY): Payer: Self-pay | Admitting: Infectious Diseases

## 2023-04-10 DIAGNOSIS — M25551 Pain in right hip: Secondary | ICD-10-CM

## 2023-04-10 DIAGNOSIS — M25552 Pain in left hip: Secondary | ICD-10-CM

## 2023-04-25 ENCOUNTER — Emergency Department (HOSPITAL_COMMUNITY)
Admission: EM | Admit: 2023-04-25 | Discharge: 2023-04-25 | Disposition: A | Discharge status: Home / Self Care | Payer: No Typology Code available for payment source | Attending: Emergency Medicine | Admitting: Emergency Medicine

## 2023-04-25 ENCOUNTER — Other Ambulatory Visit: Payer: Self-pay

## 2023-04-25 ENCOUNTER — Encounter (HOSPITAL_COMMUNITY): Payer: Self-pay | Admitting: Emergency Medicine

## 2023-04-25 ENCOUNTER — Emergency Department (HOSPITAL_COMMUNITY): Payer: No Typology Code available for payment source

## 2023-04-25 DIAGNOSIS — M5441 Lumbago with sciatica, right side: Secondary | ICD-10-CM | POA: Diagnosis not present

## 2023-04-25 DIAGNOSIS — Z91040 Latex allergy status: Secondary | ICD-10-CM | POA: Insufficient documentation

## 2023-04-25 DIAGNOSIS — M545 Low back pain, unspecified: Secondary | ICD-10-CM | POA: Diagnosis present

## 2023-04-25 DIAGNOSIS — Z96641 Presence of right artificial hip joint: Secondary | ICD-10-CM | POA: Insufficient documentation

## 2023-04-25 DIAGNOSIS — Z981 Arthrodesis status: Secondary | ICD-10-CM

## 2023-04-25 MED ORDER — FENTANYL CITRATE PF 50 MCG/ML IJ SOSY
50.0000 ug | PREFILLED_SYRINGE | Freq: Once | INTRAMUSCULAR | Status: AC
  Administered 2023-04-25: 50 ug via INTRAVENOUS
  Filled 2023-04-25: qty 1

## 2023-04-25 MED ORDER — DIAZEPAM 5 MG/ML IJ SOLN
5.0000 mg | Freq: Once | INTRAMUSCULAR | Status: DC | PRN
  Administered 2023-04-25: 5 mg via INTRAVENOUS
  Filled 2023-04-25: qty 2

## 2023-04-25 NOTE — Discharge Instructions (Addendum)
You were seen in the ER for low back pain.  As we discussed, we were able to do MRI's of your lumbar spine and both of your hips. Your hips did not show any changes, your lumbar spine showed mild canal stenosis and narrowing at the nerve roots bilaterally at L4-L5.  I recommend continuing your home pain regimen, rest, heating pad, etc.  [facility]. It was a pleasure taking care of you today and I hope you feel better soon!

## 2023-04-25 NOTE — ED Provider Notes (Signed)
[place] EMERGENCY DEPARTMENT AT [facility] Provider Note   CSN: [id number] Arrival date & time: 04/25/23  6962     History  Chief Complaint  Patient presents with   Back Pain    EFSTRATIOS NAKASHIMA is a 67 y.o. 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  Patient has been following with his PCP at the [facility] and they have been scheduling him for MRI of his lower back and his bilateral hips.  He states that yesterday he started experiencing some pain just below his right ankle, which is often times how his sciatic pain starts.  The pain was coming up his right leg.  He then bent over to pick up a shoe box, and had immediate sharp pain in his lower back.  States that that pain initially went across his entire low back, but then stayed on the right, wrapping around his trunk and into his groin.  His range of motion is significantly limited due to pain.  He states that he is the primary caregiver for his wife who has stage IV cancer, as well as his mother and father-in-law.  He is not complaining of any numbness, urinary retention, urine or bowel incontinence.  No symptoms in the left leg at present.  States the pain he is having now is similar to the pain that he was having before when he required surgery. He usually takes hydrocodone and tylenol for pain at home.   Back Pain      Home Medications Prior to Admission medications   Medication Sig Start Date End Date Taking? Authorizing Provider  acetaminophen (TYLENOL) 500 MG tablet Take 1,000-1,500 mg by mouth 3 (three) times daily as needed (pain/headaches.).    [provider]  albuterol (VENTOLIN HFA) 108 (90 Base) MCG/ACT inhaler Inhale 2 puffs into the lungs every 6 (six) hours as needed for  wheezing or shortness of breath.    [provider]  alfuzosin (UROXATRAL) 10 MG 24 hr tablet Take 10 mg by mouth at bedtime. 12/21/15   [provider]  cetirizine (ZYRTEC) 10 MG tablet Take 10 mg by mouth at bedtime.    [provider]  HYDROcodone-acetaminophen (NORCO/VICODIN) 5-325 MG tablet Take 1 tablet by mouth every 4 (four) hours as needed for moderate pain. Patient taking differently: Take 0.5 tablets by mouth 2 (two) times daily as needed for moderate pain. 08/13/22   Gilda Crease, MD  ketotifen (ZADITOR) 0.025 % ophthalmic solution Place 1 drop into both eyes 2 (two) times daily as needed (allergy eyes).    [provider]  mometasone (ELOCON) 0.1 % lotion Apply 2-3 drops topically daily as needed (ear fungus/itching in ears).    [provider]  tiZANidine (ZANAFLEX) 4 MG tablet Take 1 tablet (4 mg total) by mouth every 8 (eight) hours as needed for muscle spasms. Do not take with alcohol or while driving or operating heavy machinery.  May cause drowsiness. Patient not taking: Reported on 09/26/2022 08/10/22   Valentino Nose, NP  traZODone (DESYREL) 50 MG tablet Take 25 mg by mouth at bedtime.    [provider]      Allergies    Alpha-gal, Aspirin, Augmentin [amoxicillin-pot clavulanate], Bactrim [sulfamethoxazole-trimethoprim], Ciprofloxacin, Cobalt, Latex, Methadone, Morphine and codeine, Nickel, Nsaids, Opium, Prednisone, Protonix [pantoprazole], Sulfa  antibiotics, Cortisone, Demerol [meperidine hcl], Flunisolide, Kenalog [triamcinolone], Lidocaine, Neomycin-bacitracin zn-polymyx, Neurontin [gabapentin], Nortriptyline, Other, Percocet [oxycodone-acetaminophen], and Zocor [simvastatin]    Review of Systems   Review of Systems  Musculoskeletal:  Positive for back pain.  All other systems reviewed and are negative.   Physical Exam Updated Vital Signs BP 136/84   Pulse 63   Temp 97.8 F (36.6 C) (Oral)   Resp 18    Ht 5\' 10"  (1.778 m)   Wt 86.2 kg   SpO2 93%   BMI 27.26 kg/m  Physical Exam Vitals and nursing note reviewed.  Constitutional:      Appearance: Normal appearance.  HENT:     Head: Normocephalic and atraumatic.  Eyes:     Conjunctiva/sclera: Conjunctivae normal.  Cardiovascular:     Rate and Rhythm: Normal rate and regular rhythm.     Pulses:          Posterior tibial pulses are 2+ on the right side and 2+ on the left side.  Pulmonary:     Effort: Pulmonary effort is normal. No respiratory distress.     Breath sounds: Normal breath sounds.  Abdominal:     General: There is no distension.     Palpations: Abdomen is soft.     Tenderness: There is no abdominal tenderness.  Musculoskeletal:     Comments: Severe lower back pain with range of motion, some midline tenderness around L4, L5, S1.  No deformities palpated.  Compartments of the lower extremities are soft and nontender.  Normal range of motion of the bilateral knees and ankles.  Skin:    General: Skin is warm and dry.  Neurological:     General: No focal deficit present.     Mental Status: He is alert.     Comments: Sensation intact in bilateral lower extremities     ED Results / Procedures / Treatments   Labs (all labs ordered are listed, but only abnormal results are displayed) Labs Reviewed - No data to display  EKG None  Radiology MR LUMBAR SPINE WO CONTRAST  Result Date: 04/25/2023 CLINICAL DATA:  Low back pain, prior surgery, new symptoms. EXAM: MRI LUMBAR SPINE WITHOUT CONTRAST TECHNIQUE: Multiplanar, multisequence MR imaging of the lumbar spine was performed. No intravenous contrast was administered. COMPARISON:  None Available. FINDINGS: Segmentation:  Standard. Alignment:  Trace retrolisthesis at L1-2. Vertebrae: No fracture, evidence of discitis, or bone lesion. Status post L5-S1 laminectomy and interbody fusion. Conus medullaris and cauda equina: Conus extends to the L1 level. Conus and cauda equina  appear normal. Paraspinal and other soft tissues: Negative. Disc levels: T12-L1: No spinal neural foraminal stenosis. L1-2: Mild disc bulge. No significant spinal canal or neural foraminal stenosis. L2-3: Shallow disc bulge with small left foraminal disc protrusion resulting in mild left neural foraminal stenosis. No spinal canal stenosis. L3-4: Shallow disc bulge and mild facet degenerative changes without significant spinal canal or neural foraminal. L4-5: Loss of disc height, disc bulge, mild facet degenerative changes and ligamentum flavum redundancy resulting in mild spinal canal stenosis and moderate bilateral neural foraminal narrowing. L5-S1: Postsurgical changes from laminectomy and fusion. The right neural foramina is partially obscured by susceptibility artifact from hardware which shows no high-grade stenosis. The spinal canal and left neural foramen are widely patent. IMPRESSION: 1. Mild spinal canal stenosis and moderate bilateral neural foraminal narrowing at L4-5. 2. Mild left neural foraminal stenosis at L2-3. Electronically Signed   By: Baldemar Lenis M.D.   On: 04/25/2023  14:35   MR HIP LEFT WO CONTRAST  Result Date: 04/25/2023 CLINICAL DATA:  Right hip replacement, low back pain, bilateral hip pain EXAM: MR OF THE RIGHT HIP WITHOUT CONTRAST MR OF THE LEFT HIP WITHOUT CONTRAST TECHNIQUE: Multiplanar, multisequence MR imaging of the right hip was performed. No intravenous contrast was administered. Multiplanar, multisequence MR imaging of the left hip was performed. No intravenous contrast was administered. COMPARISON:  None Available. FINDINGS: Bones: Right total hip arthroplasty with susceptibility artifact partially obscuring the adjacent soft tissue and osseous structures. No periarticular fluid collection or osteolysis. No left hip fracture, dislocation or avascular necrosis. No periosteal reaction or bone destruction. No aggressive osseous lesion. Mild osteoarthritis of  bilateral SI joints. No SI joint widening or erosive changes. Interbody cage at L5-S1. Articular cartilage and labrum Articular cartilage:  No chondral defect. Labrum: Grossly intact, but evaluation is limited by lack of intraarticular fluid. Joint or bursal effusion Joint effusion:  No hip joint effusion.  No SI joint effusion. Bursae:  No bursal fluid. Muscles and tendons Flexors: Normal. Extensors: Normal. Abductors: Normal. Adductors: Normal. Gluteals: Mild tendinosis of the left gluteus minimus tendon insertion. No muscle atrophy. Hamstrings: Mild tendinosis of the left hamstring origin. Minimal tendinosis of the right hamstring origin. Other findings No pelvic free fluid. No fluid collection or hematoma. No inguinal lymphadenopathy. No inguinal hernia. IMPRESSION: 1. Right total hip arthroplasty with susceptibility artifact partially obscuring the adjacent soft tissue and osseous structures. No periarticular fluid collection or osteolysis. 2. No left hip fracture, dislocation or avascular necrosis. 3. Mild tendinosis of the left gluteus minimus tendon insertion. 4. Mild tendinosis of the left hamstring origin. Minimal tendinosis of the right hamstring origin. Electronically Signed   By: Elige Ko M.D.   On: 04/25/2023 14:21   MR HIP RIGHT WO CONTRAST  Result Date: 04/25/2023 CLINICAL DATA:  Right hip replacement, low back pain, bilateral hip pain EXAM: MR OF THE RIGHT HIP WITHOUT CONTRAST MR OF THE LEFT HIP WITHOUT CONTRAST TECHNIQUE: Multiplanar, multisequence MR imaging of the right hip was performed. No intravenous contrast was administered. Multiplanar, multisequence MR imaging of the left hip was performed. No intravenous contrast was administered. COMPARISON:  None Available. FINDINGS: Bones: Right total hip arthroplasty with susceptibility artifact partially obscuring the adjacent soft tissue and osseous structures. No periarticular fluid collection or osteolysis. No left hip fracture,  dislocation or avascular necrosis. No periosteal reaction or bone destruction. No aggressive osseous lesion. Mild osteoarthritis of bilateral SI joints. No SI joint widening or erosive changes. Interbody cage at L5-S1. Articular cartilage and labrum Articular cartilage:  No chondral defect. Labrum: Grossly intact, but evaluation is limited by lack of intraarticular fluid. Joint or bursal effusion Joint effusion:  No hip joint effusion.  No SI joint effusion. Bursae:  No bursal fluid. Muscles and tendons Flexors: Normal. Extensors: Normal. Abductors: Normal. Adductors: Normal. Gluteals: Mild tendinosis of the left gluteus minimus tendon insertion. No muscle atrophy. Hamstrings: Mild tendinosis of the left hamstring origin. Minimal tendinosis of the right hamstring origin. Other findings No pelvic free fluid. No fluid collection or hematoma. No inguinal lymphadenopathy. No inguinal hernia. IMPRESSION: 1. Right total hip arthroplasty with susceptibility artifact partially obscuring the adjacent soft tissue and osseous structures. No periarticular fluid collection or osteolysis. 2. No left hip fracture, dislocation or avascular necrosis. 3. Mild tendinosis of the left gluteus minimus tendon insertion. 4. Mild tendinosis of the left hamstring origin. Minimal tendinosis of the right hamstring origin. Electronically Signed   By:  Elige Ko M.D.   On: 04/25/2023 14:21    Procedures Procedures    Medications Ordered in ED Medications  diazepam (VALIUM) injection 5 mg (5 mg Intravenous Given 04/25/23 1232)  fentaNYL (SUBLIMAZE) injection 50 mcg (50 mcg Intravenous Given 04/25/23 1024)  fentaNYL (SUBLIMAZE) injection 50 mcg (50 mcg Intravenous Given 04/25/23 1358)    ED Course/ Medical Decision Making/ A&P                                 Medical Decision Making Amount and/or Complexity of Data Reviewed Radiology: ordered.  Risk Prescription drug management.   This patient is a 67 y.o. male  who presents  to the ED for concern of lower back pain radiating down the R leg.   Differential diagnoses prior to evaluation: The emergent differential diagnosis includes, but is not limited to,  Fracture (acute/chronic), muscle strain, cauda equina, spinal stenosis, DDD, ligamentous injury, disk herniation, metastatic cancer, vertebral osteomyelitis, meningitis. This is not an exhaustive differential.   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 Pertinent results include: [facility] and [facility] encounters with orders for MRI lumbar spine, discussions about MRI of hips.   Physical Exam: Physical exam performed. The pertinent findings include: Normal vital signs, no acute distress.  Midline spinal tenderness to palpation at the lumbosacral level.  No deformities palpated.  Significant pain with range of motion of the lower spine.  Sensation intact in bilateral lower extremities.  Lab Tests/Imaging studies: I personally interpreted labs/imaging and the pertinent results include: MRI of lumbar spine, left and right hips shows mild n final canal stenosis and moderate bilateral neural foraminal narrowing at 3 4-5, mild left neuroforaminal stenosis at L2-3.  Hips unremarkable. I agree with the radiologist interpretation.  Medications: I ordered medication including internal, Valium for MRI.  I have reviewed the patients home medicines and have made adjustments as needed.   Disposition: After consideration of the diagnostic results and the patients response to treatment, I feel that emergency department workup does not suggest an emergent condition requiring admission or immediate intervention beyond what has been performed at this time. The plan is: Discharge to home with  symptomatic management of acute on chronic lower back pain.  Patient states he has enough hydrocodone and Tylenol at home, and will use a heating pad and follow-up with his primary doctor.  No emergent etiology found for symptoms today, suspect an acute flare of his degenerative disc disease after injury. The patient is safe for discharge and has been instructed to return immediately for worsening symptoms, change in symptoms or any other concerns.  Final Clinical Impression(s) / ED Diagnoses Final diagnoses:  Acute right-sided low back pain with right-sided sciatica  History of lumbar fusion    Rx / DC Orders ED Discharge Orders     None      Portions of this report may have been transcribed using voice recognition software. Every effort was made to ensure accuracy; however, inadvertent computerized transcription errors may be present.    Su Monks, PA-C 04/25/23 1809    Sloan Leiter, DO 04/26/23 [phone]

## 2023-04-25 NOTE — ED Notes (Signed)
Pt returned from MRI °

## 2023-04-25 NOTE — ED Triage Notes (Signed)
Pt states lower back pain since yesterday. Has had lumbar fusions per pt. Pain down right leg. Rom limited due to pain. Pt needed w/c to get back to room. A/o.

## 2023-07-03 ENCOUNTER — Encounter (HOSPITAL_BASED_OUTPATIENT_CLINIC_OR_DEPARTMENT_OTHER): Payer: Self-pay | Admitting: Physical Therapy

## 2023-07-03 ENCOUNTER — Other Ambulatory Visit: Payer: Self-pay

## 2023-07-03 ENCOUNTER — Ambulatory Visit (HOSPITAL_BASED_OUTPATIENT_CLINIC_OR_DEPARTMENT_OTHER): Payer: No Typology Code available for payment source | Attending: Orthopedic Surgery | Admitting: Physical Therapy

## 2023-07-03 DIAGNOSIS — R2689 Other abnormalities of gait and mobility: Secondary | ICD-10-CM | POA: Diagnosis present

## 2023-07-03 DIAGNOSIS — M6281 Muscle weakness (generalized): Secondary | ICD-10-CM | POA: Insufficient documentation

## 2023-07-03 DIAGNOSIS — M25552 Pain in left hip: Secondary | ICD-10-CM | POA: Insufficient documentation

## 2023-07-03 NOTE — Therapy (Addendum)
OUTPATIENT PHYSICAL THERAPY LOWER EXTREMITY EVALUATION PHYSICAL THERAPY DISCHARGE SUMMARY  Visits from Start of Care: 1  Current functional level related to goals / functional outcomes: unknown   Remaining deficits: unknown   Education / Equipment: Initial EDU at eval   Patient agrees to discharge. Patient goals were not met. Patient is being discharged due to  pt declined PT due to cold weather.  Addend Corrie Dandy Tomma Lightning) Pattijo Juste MPT 08/16/23 11:51 AM [facility] [address] [place], [place], [address] Phone: [phone]   Fax:  [phone]    Patient Name: Levi Kim MRN: [id number] DOB:1956-06-11, 67 y.o., male Today's Date: 07/03/2023  END OF SESSION:  PT End of Session - 07/03/23 1159     Visit Number 1    Number of Visits 15    Date for PT Re-Evaluation 09/29/23    Authorization Type Department of [facility]  [id number]  15 visits approved from   11.19.24-3.19.25  [facility] Contact: Cory Roughen [phone]    Progress Note Due on Visit 10    PT Start Time 1015    PT Stop Time 1055    PT Time Calculation (min) 40 min    Activity Tolerance Patient tolerated treatment well;Patient limited by pain    Behavior During Therapy [facility] for tasks assessed/performed             Past Medical History:  Diagnosis Date   Alpha galactosidase deficiency    Arthritis    Arthritis    Asthma    High cholesterol    Past Surgical History:  Procedure Laterality Date   APPENDECTOMY     BACK SURGERY     Fistula Repair     HERNIA REPAIR     JOINT REPLACEMENT  2017   SHOULDER SURGERY     SINUS EXPLORATION     testical removal     TONSILLECTOMY     Patient Active Problem List   Diagnosis Date Noted   Borderline personality disorder (HCC) 04/12/2021   DDD (degenerative disc disease), cervical 04/12/2021   Degeneration of lumbar intervertebral disc 04/12/2021   Enthesopathy of ankle and tarsus 04/12/2021    Gastroesophageal reflux disease 04/12/2021   History of total hip arthroplasty 04/12/2021   Leg length inequality 04/12/2021   Other specified disorders of bone, lower leg 04/12/2021   Pain in finger of left hand 04/12/2021   Pain in joint, hand 04/12/2021   Pain in right hip 04/12/2021   Polyp of nasal sinus 04/12/2021   Sprain of rotator cuff capsule 04/12/2021   Change in stool 08/25/2020   History of colonic polyps 08/25/2020   Presbycusis of both ears 01/19/2020   Generalized abdominal pain 07/04/2017    PCP: Dr Orvan July  REFERRING PROVIDER: Julien Girt, Alexzandrew L, PA-C   REFERRING DIAG: Pain in L hip  THERAPY DIAG:  Pain in left hip  Muscle weakness (generalized)  Other abnormalities of gait and mobility  Rationale for Evaluation and Treatment: Rehabilitation  ONSET DATE: >10 yrs  SUBJECTIVE:   SUBJECTIVE STATEMENT: I put in a call to the dr so all of my problems are addressed.  THR right due 2017 to avn.  Have it in left as well. Cervical pne herniations  numnbess, tingling, pain. I stay tight through my shoulders, traps and throughout shoulders. Osterophytes 90% of joints in body, tendonitis top of foot, hurts all the time.  L4-5 stenosis. Somethimes need to push a cart if it flares when shopping.  Right  shoulder surgery 2017, left rot cuff have 2 tears for a long time. Don't use cane as it flares neck and shoulder. Don't sleep well. Coming from [place].  I have a massage therapy for maintenance a couple times a month. This is constant.  This will just get me loosened up. IT band on right tight. "I will not go to land based PT due to my limitations. I'm allergic to pools if PH is not at neutral or if they use a certain kind of chlorine"  PERTINENT HISTORY: Borderline personality disorder  PMHx: left femur and tibia fx 1970's PAIN:  Are you having pain? Yes: NPRS scale: current 7/10; worst 7/10; least 5-6/10 Pain location: Left hip Pain description:  ache Aggravating factors: cold weather all static and dynamic positioning and movements Relieving factors: nothing; sometime rest, heating pad; topical analgesia  PRECAUTIONS: None  RED FLAGS: None   WEIGHT BEARING RESTRICTIONS: No  FALLS:  Has patient fallen in last 6 months? No  LIVING ENVIRONMENT: Lives with: lives with their spouse Lives in: House/apartment Stairs: Yes: External: 2 steps; none Has following equipment at home: None  OCCUPATION: Retired  PLOF: Independent  PATIENT GOALS: Decrease pain  NEXT MD VISIT: as needed  OBJECTIVE:  Note: Objective measures were completed at Evaluation unless otherwise noted.  DIAGNOSTIC FINDINGS: MRI Left hip IMPRESSION: 1. Right total hip arthroplasty with susceptibility artifact partially obscuring the adjacent soft tissue and osseous structures. No periarticular fluid collection or osteolysis. 2. No left hip fracture, dislocation or avascular necrosis. 3. Mild tendinosis of the left gluteus minimus tendon insertion. 4. Mild tendinosis of the left hamstring origin. Minimal tendinosis of the right hamstring origin.  PATIENT SURVEYS:  FOTO Primary score 31% with goal of 47%  COGNITION: Overall cognitive status: Within functional limits for tasks assessed    MUSCLE LENGTH: Hamstrings: tight bilat   POSTURE:  Right leg length difference since surgery 3/4 of lift (wears lift)  PALPATION: Slight TTP throughout left glut and hamstring   LUMBAR ROM:    All movements limited by pain Active  A/PROM  eval  Flexion FT to patella  Extension   Right lateral flexion 75% limited  Left lateral flexion 75% limited  Right rotation   Left rotation    (Blank rows = not tested)   LOWER EXTREMITY Strength:   Does not tolerate HD increases Pain (=P!) Strength Right eval Left eval  Hip flexion 3P! 3P!  Hip extension 3- 3-  Hip abduction 3P! 3P!  Hip adduction 3P! 3p!  Hip internal rotation    Hip external rotation     Knee flexion 3+P! 3+P!  Knee extension    Ankle dorsiflexion    Ankle plantarflexion    Ankle inversion    Ankle eversion     (Blank rows = not tested)  LOWER EXTREMITY ROM:  Hip and knee Limited seconday to pain  FUNCTIONAL TESTS:  Timed up and go (TUG): 15.71 4 stage  NBOS and semi tandem passed; Tandem x 5 s; SLS x 3  GAIT: Distance walked: 500 ft Assistive device utilized: None Level of assistance: Complete Independence Comments: Increased lateral displacement, prolonged stance time bilaterally, initial decreased hip/knee flex which improves with distance.   TODAY'S TREATMENT:  Eval   PATIENT EDUCATION:  Education details: Discussed eval findings, rehab rationale, aquatic program progression/POC and pools in area. Patient is in agreement  Person educated: Patient Education method: Explanation Education comprehension: verbalized understanding  HOME EXERCISE PROGRAM: TB assigned.  Pt declines land based intervention/ex  ASSESSMENT:  CLINICAL IMPRESSION: Patient is a 67 y.o. m who was seen today for physical therapy evaluation and treatment for L hip pain. Pt has extensive orthopedic PmHx including most joints.  He has had back, right shoulder and hip surgery. Enthesopathy of ankles and mild tendinosis of the left gluteus minimus tendon and hamstring. He presents today reporting all over body pain.  Testing indicates muscle weakness throughout core and LE with significant limitations due to pain, balance deficits and gait instability.  He states he has had some allergic reactions to pool water in the past so will need to be tested on small area of skin on arm at first session.  He will benefit from skill aquatic physical therapy using the properties of water to improve pain management, strength, ROM and balance ability for improved and safe function and  QOL.  He has refused land based intervention.   OBJECTIVE IMPAIRMENTS: Abnormal gait, decreased activity tolerance, decreased balance, decreased mobility, decreased ROM, decreased strength, impaired flexibility, postural dysfunction, and pain.   ACTIVITY LIMITATIONS: carrying, lifting, bending, sitting, standing, squatting, sleeping, stairs, transfers, reach over head, and locomotion level  PARTICIPATION LIMITATIONS: meal prep, cleaning, laundry, driving, shopping, community activity, and yard work  PERSONAL FACTORS: Behavior pattern, Fitness, Past/current experiences, Time since onset of injury/illness/exacerbation, and 1-2 comorbidities: see chart above  are also affecting patient's functional outcome.   REHAB POTENTIAL: Fair to good.  PT uncertain pt will follow through with services  CLINICAL DECISION MAKING: Unstable/unpredictable  EVALUATION COMPLEXITY: High   GOALS: Goals reviewed with patient? Yes  SHORT TERM GOALS: Target date: 08/30/23 Pt will tolerate full aquatic sessions consistently without increase in pain and with improving function to demonstrate good toleration and effectiveness of intervention.  Baseline: Goal status: INITIAL  2.  Pt will complete 10 consecutive STS from water bench onto water step without UE support without LOB or rest periods Baseline:  Goal status: INITIAL    LONG TERM GOALS: Target date: 09/29/23  Pt to improve Foto Goal score by 10% to demonstrate improved perception of function Baseline: 31 Goal status: INITIAL  2.  Pt to report decrease in pain of hip by 3 NPRS for improved toleration to activity Baseline:  Goal status: INITIAL  3.  Pt will improve strength in bilateral hips by at least 1 full grade  to demonstrate improved overall physical function  Baseline:  Goal status: INITIAL  4.  Pt will improve on Tug test to <or= 11s to demonstrate improvement in lower extremity function, mobility and decreased fall risk. Baseline:  15.71 Goal status: INITIAL  5.  Pt will complete Tandem stance and SLS x 15s to demonstrate improved balance and enhance coordination and stability. Baseline:  Goal status: INITIAL    PLAN:  PT FREQUENCY: 1x/week  PT DURATION: 12 weeks  PLANNED INTERVENTIONS: 97164- PT Re-evaluation, 97110-Therapeutic exercises, 97530- Therapeutic activity, [id number]- Neuromuscular re-education, 97535- Self Care, 65784- Manual therapy, [phone]- Gait training, [phone]- Aquatic Therapy, Patient/Family education, Balance training, Stair training, Taping, Dry Needling, Joint mobilization, DME instructions, Cryotherapy, and Moist heat  PLAN FOR NEXT SESSION: Aquatics: test water on area of arm to check for allergic reaction.  LE and core strengthening; balance/proprioception retraining, LB stretching  Geni Bers, PT 07/03/2023, 12:06 PM

## 2023-08-08 ENCOUNTER — Ambulatory Visit (HOSPITAL_BASED_OUTPATIENT_CLINIC_OR_DEPARTMENT_OTHER): Payer: No Typology Code available for payment source | Admitting: Physical Therapy

## 2023-08-15 ENCOUNTER — Ambulatory Visit (HOSPITAL_BASED_OUTPATIENT_CLINIC_OR_DEPARTMENT_OTHER): Payer: No Typology Code available for payment source | Attending: Orthopedic Surgery | Admitting: Physical Therapy

## 2023-08-22 ENCOUNTER — Ambulatory Visit (HOSPITAL_BASED_OUTPATIENT_CLINIC_OR_DEPARTMENT_OTHER): Payer: No Typology Code available for payment source | Admitting: Physical Therapy

## 2023-08-29 ENCOUNTER — Ambulatory Visit (HOSPITAL_BASED_OUTPATIENT_CLINIC_OR_DEPARTMENT_OTHER): Payer: No Typology Code available for payment source | Admitting: Physical Therapy

## 2023-10-12 ENCOUNTER — Ambulatory Visit: Admission: EM | Admit: 2023-10-12 | Discharge: 2023-10-15 | Disposition: A | Discharge status: Home / Self Care

## 2023-12-31 ENCOUNTER — Encounter: Payer: Self-pay | Admitting: Neurology

## 2024-01-09 NOTE — Progress Notes (Signed)
 Assessment/Plan:   Chronic pain syndrome  - Patient sent here for evaluation of tremor, but none seen on examination today.  Upon discussion, his tremulousness is really associated with his larger chronic pain syndrome.  There was no evidence of any type of tremor on examination.  Did not see any evidence of enhanced physiologic tremor, essential tremor, parkinsonian tremor.  Subjective:   Levi Kim was seen today in the movement disorders clinic for neurologic consultation at the request of Blondell Burgess, PA-C.  Patient is a 68 year old male with a history of chronic pain syndrome on chronic opioids, reflux, personality disorder who presents for evaluation of tremor.  Records made available to me are reviewed.  Extensive allergy list is reviewed.  Very little information is available regarding tremor in the [place] records that are sent to me.  Tremor: its associated with pain and cervical osteophytes - its been going on 30 years.  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  Has difficulty staying focused on tremor alone as its intertwined with the pain.  When he holds a pen, he has a tremor because he has a ruptured pulley and arthritis and nerve impingement in the hand.  States if he holds a larger barrel pen, he has no issue because the nerve impingement isn't a problem.  It comes and goes and its related to my neck, its pain and my supraspinatous problem.  States that the [place] [facility] is trying to farm you out to other places, although his tremor is related to pain.  He has no tremor if at rest.  No tremor medications.  He doesn't use albuterol  but 1 time per month, if that.     No prior neuroimaging of the brain is made available to me.   ALLERGIES:   Allergies  Allergen Reactions   Alpha-Gal     NO ANIMAL/MAMMAL PRODUCTS    Aspirin Anaphylaxis   Augmentin [Amoxicillin -Pot Clavulanate] Other (See Comments)     Patient states has caused GI bleed   Bactrim [Sulfamethoxazole-Trimethoprim] Shortness Of Breath   Ciprofloxacin Other (See Comments)    States has caused severe achilles pain but no tear   Cobalt     Burns skin, causes irritation DO NOT USE OTHER METALS   ONLY USE TITANIUM    Latex Dermatitis    Burns skin, causes irritation    Methadone Anaphylaxis   Morphine And Codeine Anaphylaxis    Pt allergic to all narcotic pain meds per pt   Nickel     Burns skin, causes irritation   DO NOT USE OTHER METALS  ONLY USE TITANIUM     Nsaids Anaphylaxis, Shortness Of Breath and Swelling   Opium Anaphylaxis    NOT PO!!! (IV USE ONLY)   Prednisone Swelling    Patient states severe swelling with prednisone. Per patient, has previously tolerated methylprednisolone     Protonix [Pantoprazole] Nausea And Vomiting    Severe    Sulfa Antibiotics Shortness Of Breath    Patient says supposed to stay away due to breathing problems   Cortisone     Injection and topical cause severe allergic reaction    Demerol [Meperidine Hcl] Other (See Comments)    Hallucinations    Flunisolide     Allergic reaction    Kenalog [Triamcinolone]     Allergic reaction after injection    Lidocaine     Allergic reaction after injection    Neomycin -Bacitracin Zn-Polymyx  Causes severe allergic reaction    Neurontin [Gabapentin]     Migraines    Nortriptyline     Muscle spasms/pain, Insomnia    Other Nausea And Vomiting    Magic Mouthwash    Percocet [Oxycodone-Acetaminophen ] Other (See Comments)    Itching, Upsets stomach    Zocor [Simvastatin]     Hyperglycemia    CURRENT MEDICATIONS:  Current Outpatient Medications  Medication Instructions   acetaminophen  (TYLENOL ) 1,000-1,500 mg, 3 times daily PRN   albuterol  (VENTOLIN  HFA) 108 (90 Base) MCG/ACT inhaler 2 puffs, Every 6 hours PRN   alfuzosin (UROXATRAL) 10 mg, Daily at bedtime   cetirizine (ZYRTEC) 10 mg, Daily at bedtime   HYDROcodone -acetaminophen   (NORCO/VICODIN) 5-325 MG tablet 1 tablet, Oral, Every 4 hours PRN   ketotifen (ZADITOR) 0.025 % ophthalmic solution 1 drop, 2 times daily PRN   mometasone (ELOCON) 0.1 % lotion 2-3 drops, Daily PRN   traZODone (DESYREL) 25 mg, Daily at bedtime    Objective:   PHYSICAL EXAMINATION:    VITALS:   Vitals:   01/14/24 0919  BP: (!) 150/86  Pulse: 91  SpO2: 99%  Weight: 204 lb (92.5 kg)  Height: 5' 10 (1.778 m)    GEN:  The patient appears stated age and is in NAD. Pt with some difficulty staying focused on topic. HEENT:  Normocephalic, atraumatic.  The mucous membranes are moist. The superficial temporal arteries are without ropiness or tenderness. CV:  RRR Lungs:  CTAB Neck/HEME:  There are no carotid bruits bilaterally.  Neurological examination:  Orientation: The patient is alert and oriented x3.  Cranial nerves: There is good facial symmetry.  Extraocular muscles are intact. The visual fields are full to confrontational testing. The speech is fluent and clear. Soft palate rises symmetrically and there is no tongue deviation. Hearing is decreased to conversational tone. Sensation: Sensation is intact to light touch throughout (facial, trunk, extremities). Vibration is intact at the bilateral ankle. There is no extinction with double simultaneous stimulation.  Motor: Strength is at least 4+/5 in the bilateral upper and lower extremities.  There is diffuse give way weakness that improves with encouragement.  Reports pain with all manual motor testing, which limits this task.  Shoulder shrug is equal and symmetric.  There is no pronator drift. Deep tendon reflexes: Deep tendon reflexes are 2/4 at the bilateral biceps, triceps, brachioradialis, patella and achilles.   Movement examination: Tone: There is normal tone in the bilateral upper extremities.  The tone in the lower extremities is normal.  Abnormal movements: There is no rest tremor.  There is no postural tremor.  There is no  intention tremor.  Patient showed me the paperwork that he filled out and no tremor is noted.  No tremor is noted with Archimedes spirals.     Coordination:  There is no decremation with RAM's Gait and Station: The patient pushes off to arise.  He is wide-based but steady. I have reviewed and interpreted the following labs independently   Chemistry      Component Value Date/Time   NA 138 08/13/2022 0132   NA 141 01/12/2022 1343   K 3.3 (L) 08/13/2022 0132   CL 101 08/13/2022 0132   CO2 28 08/13/2022 0132   BUN 10 08/13/2022 0132   BUN 12 01/12/2022 1343   CREATININE 0.75 08/13/2022 0132      Component Value Date/Time   CALCIUM 8.9 08/13/2022 0132   ALKPHOS 53 07/28/2019 1045   AST 18 07/28/2019 1045  ALT 13 07/28/2019 1045   BILITOT 1.1 07/28/2019 1045      No results found for: TSH Lab Results  Component Value Date   WBC 8.0 08/13/2022   HGB 15.8 08/13/2022   HCT 47.0 08/13/2022   MCV 92.2 08/13/2022   PLT 266 08/13/2022      Total time spent on today's visit was 45 minutes, including both face-to-face time and nonface-to-face time.  Time included that spent on review of records (prior notes available to me/labs/imaging if pertinent), discussing treatment and goals, answering patient's questions and coordinating care.  Cc:  Guha, Bhuvana, MD

## 2024-01-14 ENCOUNTER — Encounter: Payer: Self-pay | Admitting: Neurology

## 2024-01-14 ENCOUNTER — Ambulatory Visit (INDEPENDENT_AMBULATORY_CARE_PROVIDER_SITE_OTHER): Admitting: Neurology

## 2024-01-14 VITALS — BP 150/86 | HR 91 | Ht 70.0 in | Wt 204.0 lb

## 2024-01-14 DIAGNOSIS — G894 Chronic pain syndrome: Secondary | ICD-10-CM | POA: Diagnosis not present

## 2024-01-14 NOTE — Patient Instructions (Signed)
 Good to see you! As we discussed I don't see any neurologic causes of tremor.  You will need to schedule a follow up with the [facility] medical center.  The physicians and staff at [facility] are committed to providing excellent care. You may receive a survey requesting feedback about your experience at our office. We strive to receive very good responses to the survey questions. If you feel that your experience would prevent you from giving the office a very good  response, please contact our office to try to remedy the situation. We may be reached at [phone]. Thank you for taking the time out of your busy day to complete the survey.

## 2024-03-21 ENCOUNTER — Encounter: Payer: Self-pay | Admitting: Radiology

## 2024-06-02 ENCOUNTER — Encounter: Payer: Self-pay | Admitting: Radiology

## 2024-08-28 ENCOUNTER — Encounter (HOSPITAL_COMMUNITY): Payer: Self-pay

## 2024-08-28 ENCOUNTER — Emergency Department (HOSPITAL_COMMUNITY): Admission: EM | Admit: 2024-08-28 | Discharge: 2024-08-28 | Disposition: A | Discharge status: Home / Self Care

## 2024-08-28 ENCOUNTER — Other Ambulatory Visit: Payer: Self-pay

## 2024-08-28 ENCOUNTER — Emergency Department (HOSPITAL_COMMUNITY)

## 2024-08-28 DIAGNOSIS — W001XXA Fall from stairs and steps due to ice and snow, initial encounter: Secondary | ICD-10-CM | POA: Insufficient documentation

## 2024-08-28 DIAGNOSIS — Z91040 Latex allergy status: Secondary | ICD-10-CM | POA: Insufficient documentation

## 2024-08-28 DIAGNOSIS — S0990XA Unspecified injury of head, initial encounter: Secondary | ICD-10-CM | POA: Insufficient documentation

## 2024-08-28 DIAGNOSIS — M25522 Pain in left elbow: Secondary | ICD-10-CM | POA: Insufficient documentation

## 2024-08-28 DIAGNOSIS — M542 Cervicalgia: Secondary | ICD-10-CM | POA: Insufficient documentation

## 2024-08-28 DIAGNOSIS — W19XXXA Unspecified fall, initial encounter: Secondary | ICD-10-CM

## 2024-08-28 NOTE — ED Notes (Signed)
 Patient transported to X-ray

## 2024-08-28 NOTE — ED Provider Notes (Signed)
 " [place] EMERGENCY DEPARTMENT AT [facility] Provider Note   CSN: [id number] Arrival date & time: 08/28/24  1742     Patient presents with: Levi Kim is a 69 y.o. male patient who presents to the emergency department today for further evaluation of a mechanical slip and fall that occurred just prior to arrival.  Patient states that he was trying to walk down his steps outside when he slipped on the ice and fell backwards.  He did hit his head.  He did not lose consciousness nor is he anticoagulated.  Also hit his left elbow.  Denies any other injury.      Fall       Prior to Admission medications  Medication Sig Start Date End Date Taking? Authorizing Provider  acetaminophen  (TYLENOL ) 500 MG tablet Take 1,000-1,500 mg by mouth 3 (three) times daily as needed (pain/headaches.).    [provider]  albuterol  (VENTOLIN  HFA) 108 (90 Base) MCG/ACT inhaler Inhale 2 puffs into the lungs every 6 (six) hours as needed for wheezing or shortness of breath.    [provider]  alfuzosin (UROXATRAL) 10 MG 24 hr tablet Take 10 mg by mouth at bedtime. 12/21/15   [provider]  cetirizine (ZYRTEC) 10 MG tablet Take 10 mg by mouth at bedtime.    [provider]  HYDROcodone -acetaminophen  (NORCO/VICODIN) 5-325 MG tablet Take 1 tablet by mouth every 4 (four) hours as needed for moderate pain. Patient taking differently: Take 0.5 tablets by mouth 2 (two) times daily as needed for moderate pain. 08/13/22   Haze Lonni PARAS, MD  ketotifen (ZADITOR) 0.025 % ophthalmic solution Place 1 drop into both eyes 2 (two) times daily as needed (allergy eyes).    [provider]  mometasone (ELOCON) 0.1 % lotion Apply 2-3 drops topically daily as needed (ear fungus/itching in ears). Patient not taking: Reported on 01/14/2024    [provider]  traZODone (DESYREL) 50 MG tablet Take 25 mg by mouth at bedtime.    [provider]     Allergies: Alpha-gal, Aspirin, Augmentin [amoxicillin -pot clavulanate], Bactrim [sulfamethoxazole-trimethoprim], Ciprofloxacin, Cobalt, Latex, Methadone, Morphine and codeine, Nickel, Nsaids, Opium, Prednisone, Protonix [pantoprazole], Sulfa antibiotics, Cortisone, Demerol [meperidine hcl], Flunisolide, Kenalog [triamcinolone], Lidocaine, Neomycin -bacitracin zn-polymyx, Neurontin [gabapentin], Nortriptyline, Other, Percocet [oxycodone-acetaminophen ], and Zocor [simvastatin]    Review of Systems  All other systems reviewed and are negative.   Updated Vital Signs BP (!) 160/85 (BP Location: Right Arm)   Pulse 99   Temp 98.2 F (36.8 C) (Temporal)   Resp 18   Ht 5' 9 (1.753 m)   Wt 89.8 kg   SpO2 91%   BMI 29.24 kg/m   Physical Exam Vitals and nursing note reviewed.  Constitutional:      Appearance: Normal appearance.  HENT:     Head: Normocephalic and atraumatic.     Comments: No open lacerations, ecchymosis, or hematoma. Eyes:     General:        Right eye: No discharge.        Left eye: No discharge.     Conjunctiva/sclera: Conjunctivae normal.  Neck:     Comments: Full range of motion in the neck. Pulmonary:     Effort: Pulmonary effort is normal.  Musculoskeletal:     Comments: Full range of motion in the elbow with minimal tenderness.  Skin:    General: Skin is warm and dry.     Findings: No rash.  Neurological:  General: No focal deficit present.     Mental Status: He is alert.  Psychiatric:        Mood and Affect: Mood normal.        Behavior: Behavior normal.     (all labs ordered are listed, but only abnormal results are displayed) Labs Reviewed - No data to display  EKG: None  Radiology: CT Cervical Spine Wo Contrast Result Date: 08/28/2024 CLINICAL DATA:  Syncope with subsequent fall. EXAM: CT CERVICAL SPINE WITHOUT CONTRAST TECHNIQUE: Multidetector CT imaging of the cervical spine was performed without intravenous contrast. Multiplanar CT  image reconstructions were also generated. RADIATION DOSE REDUCTION: This exam was performed according to the departmental dose-optimization program which includes automated exposure control, adjustment of the mA and/or kV according to patient size and/or use of iterative reconstruction technique. COMPARISON:  None Available. FINDINGS: Alignment: There is straightening of the normal cervical spine lordosis. Skull base and vertebrae: No acute fracture. No primary bone lesion or focal pathologic process. Soft tissues and spinal canal: No prevertebral fluid or swelling. No visible canal hematoma. Disc levels: Mild endplate sclerosis, anterior osteophyte formation and posterior bony spurring are seen at the levels of C5-C6 and C6-C7. Mild to moderate severity intervertebral disc space narrowing is present at the levels of C5-C6 and C6-C7, with mild intervertebral disc space narrowing seen throughout the remainder of the cervical spine. Bilateral mild-to-moderate severity multilevel facet joint hypertrophy is noted. Upper chest: Negative. Other: None. IMPRESSION: 1. No acute fracture or subluxation in the cervical spine. 2. Mild to moderate severity multilevel degenerative changes, most prominent at the levels of C5-C6 and C6-C7. Electronically Signed   By: Suzen Dials M.D.   On: 08/28/2024 19:24   CT Head Wo Contrast Result Date: 08/28/2024 CLINICAL DATA:  Syncope with subsequent fall. EXAM: CT HEAD WITHOUT CONTRAST TECHNIQUE: Contiguous axial images were obtained from the base of the skull through the vertex without intravenous contrast. RADIATION DOSE REDUCTION: This exam was performed according to the departmental dose-optimization program which includes automated exposure control, adjustment of the mA and/or kV according to patient size and/or use of iterative reconstruction technique. COMPARISON:  None Available. FINDINGS: Brain: No evidence of acute infarction, hemorrhage, hydrocephalus, extra-axial  collection or mass lesion/mass effect. Vascular: No hyperdense vessel or unexpected calcification. Skull: Normal. Negative for fracture or focal lesion. Sinuses/Orbits: There is marked severity right ethmoid sinus mucosal thickening. A small left maxillary sinus air-fluid level is also noted. Other: None. IMPRESSION: 1. No acute intracranial abnormality. 2. Marked severity right ethmoid sinus disease. 3. Small left maxillary sinus air-fluid level. Electronically Signed   By: Suzen Dials M.D.   On: 08/28/2024 19:21   DG Elbow Complete Left Result Date: 08/28/2024 CLINICAL DATA:  Syncope with subsequent fall. EXAM: LEFT ELBOW - COMPLETE 3+ VIEW COMPARISON:  None Available. FINDINGS: There is no evidence of fracture, dislocation, or joint effusion. There is no evidence of arthropathy or other focal bone abnormality. A 1.3 mm radiopaque soft tissue foreign body is seen overlying the volar and medial aspects of the proximal left forearm. IMPRESSION: 1. No acute osseous abnormality. Electronically Signed   By: Suzen Dials M.D.   On: 08/28/2024 19:19     Procedures   Medications Ordered in the ED - No data to display  Clinical Course as of 08/28/24 1932  Thu Aug 28, 2024  1927 CT Cervical Spine Wo Contrast Negative for any acute fracture. I do agree with the radiologist interpretation.  [CF]  1927 CT  Head Wo Contrast Negative for acute intracranial hemorrhage.  I do agree with radiologist interpretation. [CF]  1928 DG Elbow Complete Left Negative for fracture.  I do agree with radiologist interpretation. [CF]    Clinical Course User Index [CF] Theotis Cameron HERO, PA-C    Medical Decision Making Levi Kim is a 69 y.o. male patient who presents to the emergency department today for further evaluation of head injury and neck pain.  Also complaining of left elbow pain.  Will be getting some imaging to look for any signs of intracranial hemorrhage, cervical spinal pathology, and a  fracture of the left olecranon.  Imaging all reassuring.  Will plan to treat with over-the-counter NSAIDs to go home with.  Patient agreeable with plan.  Strict turn precaution were discussed.  He is safe for discharge.  Amount and/or Complexity of Data Reviewed Radiology: ordered. Decision-making details documented in ED Course.    Final diagnoses:  Fall, initial encounter  Left elbow pain  Injury of head, initial encounter    ED Discharge Orders     None          Theotis Cameron HERO, [place] 08/28/24 1932    Gennaro Duwaine CROME, DO 08/30/24 1451  "

## 2024-08-28 NOTE — ED Triage Notes (Signed)
 Pt reports  I was upright one minute and the next I was looking at the stars. Pt reports hitting his head but denies loc. Pt reports left elbow pain.

## 2024-08-28 NOTE — Discharge Instructions (Signed)
 As we discussed, there was no bleeding in your brain, worsening spine problems.  No fractures on your spine, skull, or elbow.  You will be sore in the coming days.  I would like for you to follow-up with your primary care doctor for further evaluation.  You may return to the emergency department for new or symptoms.
